# Patient Record
Sex: Male | Born: 1985 | Race: Black or African American | Hispanic: No | Marital: Single | Smoking: Current every day smoker
Health system: Southern US, Community
[De-identification: ages and names within clinical notes are randomized; demographics above are authoritative.]

## PROBLEM LIST (undated history)

## (undated) DIAGNOSIS — R21 Rash and other nonspecific skin eruption: Secondary | ICD-10-CM

## (undated) DIAGNOSIS — L309 Dermatitis, unspecified: Secondary | ICD-10-CM

## (undated) HISTORY — PX: HERNIA REPAIR: SHX51

---

## 2007-08-16 ENCOUNTER — Emergency Department (HOSPITAL_COMMUNITY): Admission: EM | Admit: 2007-08-16 | Discharge: 2007-08-16 | Payer: Self-pay | Admitting: Emergency Medicine

## 2009-09-06 ENCOUNTER — Emergency Department (HOSPITAL_COMMUNITY): Admission: EM | Admit: 2009-09-06 | Discharge: 2009-09-06 | Payer: Self-pay | Admitting: Emergency Medicine

## 2013-03-18 ENCOUNTER — Emergency Department (HOSPITAL_COMMUNITY): Payer: Self-pay

## 2013-03-18 ENCOUNTER — Encounter (HOSPITAL_COMMUNITY): Payer: Self-pay | Admitting: Emergency Medicine

## 2013-03-18 ENCOUNTER — Emergency Department (HOSPITAL_COMMUNITY)
Admission: EM | Admit: 2013-03-18 | Discharge: 2013-03-18 | Disposition: A | Discharge status: Home / Self Care | Payer: Self-pay | Attending: Emergency Medicine | Admitting: Emergency Medicine

## 2013-03-18 DIAGNOSIS — M25511 Pain in right shoulder: Secondary | ICD-10-CM

## 2013-03-18 DIAGNOSIS — S4980XA Other specified injuries of shoulder and upper arm, unspecified arm, initial encounter: Secondary | ICD-10-CM | POA: Insufficient documentation

## 2013-03-18 DIAGNOSIS — Z880 Allergy status to penicillin: Secondary | ICD-10-CM | POA: Insufficient documentation

## 2013-03-18 DIAGNOSIS — X500XXA Overexertion from strenuous movement or load, initial encounter: Secondary | ICD-10-CM | POA: Insufficient documentation

## 2013-03-18 DIAGNOSIS — S43021A Posterior subluxation of right humerus, initial encounter: Secondary | ICD-10-CM

## 2013-03-18 DIAGNOSIS — S43016A Anterior dislocation of unspecified humerus, initial encounter: Secondary | ICD-10-CM | POA: Insufficient documentation

## 2013-03-18 DIAGNOSIS — S43026A Posterior dislocation of unspecified humerus, initial encounter: Secondary | ICD-10-CM | POA: Insufficient documentation

## 2013-03-18 DIAGNOSIS — Y9239 Other specified sports and athletic area as the place of occurrence of the external cause: Secondary | ICD-10-CM | POA: Insufficient documentation

## 2013-03-18 DIAGNOSIS — Y9367 Activity, basketball: Secondary | ICD-10-CM | POA: Insufficient documentation

## 2013-03-18 DIAGNOSIS — S46909A Unspecified injury of unspecified muscle, fascia and tendon at shoulder and upper arm level, unspecified arm, initial encounter: Secondary | ICD-10-CM | POA: Insufficient documentation

## 2013-03-18 DIAGNOSIS — M21821 Other specified acquired deformities of right upper arm: Secondary | ICD-10-CM

## 2013-03-18 MED ORDER — HYDROMORPHONE HCL PF 1 MG/ML IJ SOLN
1.0000 mg | Freq: Once | INTRAMUSCULAR | Status: AC
  Administered 2013-03-18: 1 mg via INTRAMUSCULAR
  Filled 2013-03-18 (×2): qty 1

## 2013-03-18 MED ORDER — HYDROCODONE-ACETAMINOPHEN 5-325 MG PO TABS: 1.0000 | ORAL_TABLET | ORAL | Status: DC | PRN

## 2013-03-18 MED ORDER — DIAZEPAM 5 MG PO TABS
10.0000 mg | ORAL_TABLET | Freq: Once | ORAL | Status: AC
  Administered 2013-03-18: 10 mg via ORAL
  Filled 2013-03-18 (×2): qty 1

## 2013-03-18 MED ORDER — BUPIVACAINE HCL 0.5 % IJ SOLN
30.0000 mL | Freq: Once | INTRAMUSCULAR | Status: DC
  Filled 2013-03-18: qty 30

## 2013-03-18 MED ORDER — BUPIVACAINE HCL (PF) 0.5 % IJ SOLN
30.0000 mL | Freq: Once | INTRAMUSCULAR | Status: AC
  Administered 2013-03-18: 30 mL
  Filled 2013-03-18: qty 30

## 2013-03-18 MED ORDER — HYDROCODONE-ACETAMINOPHEN 5-325 MG PO TABS: 2.0000 | ORAL_TABLET | ORAL | Status: DC | PRN

## 2013-03-18 MED ORDER — HYDROMORPHONE HCL PF 1 MG/ML IJ SOLN
1.0000 mg | Freq: Once | INTRAMUSCULAR | Status: AC
  Administered 2013-03-18: 1 mg via INTRAMUSCULAR
  Filled 2013-03-18: qty 1

## 2013-03-18 NOTE — ED Provider Notes (Addendum)
CSN: [id number]     Arrival date & time 03/18/13  1145 History   First MD Initiated Contact with Patient 03/18/13 1156     Chief Complaint  Patient presents with  . Shoulder Injury   (Consider location/radiation/quality/duration/timing/severity/associated sxs/prior Treatment) HPI Comments: 27 yo male with no medical hx, non smoker presents with right shoulder pain after playing bball and felt pop with reaching and sudden pain.  No hx of dislocations. Decr movement with pain.  Nothing improves. No head injury.  Patient is a 27 y.o. male presenting with shoulder injury. The history is provided by the patient.  Shoulder Injury This is a new problem. Pertinent negatives include no shortness of breath.    History reviewed. No pertinent past medical history. History reviewed. No pertinent past surgical history. No family history on file. History  Substance Use Topics  . Smoking status: Never Smoker   . Smokeless tobacco: Never Used  . Alcohol Use: Yes     Comment: Occassionally     Review of Systems  Constitutional: Negative for fever.  HENT: Negative for neck pain.   Respiratory: Negative for shortness of breath.   Musculoskeletal: Positive for arthralgias. Negative for joint swelling.  Skin: Negative for wound.    Allergies  Penicillins  Home Medications   Current Outpatient Rx  Name  Route  Sig  Dispense  Refill  . ibuprofen (ADVIL,MOTRIN) 200 MG tablet   Oral   Take 800 mg by mouth every 8 (eight) hours as needed for pain.          BP 150/58  Pulse 59  Temp(Src) 98.4 F (36.9 C) (Oral)  Resp 20  SpO2 100% Physical Exam  Nursing note and vitals reviewed. Constitutional: He appears well-developed and well-nourished. No distress.  HENT:  Head: Normocephalic and atraumatic.  Eyes: Conjunctivae are normal.  Neck: Normal range of motion. Neck supple.  Cardiovascular: Normal rate.   Pulmonary/Chest: Effort normal.  Musculoskeletal: He exhibits tenderness. He  exhibits no edema.  Right shoulder tender with all rom,  No obvious deformity nv intact distal, held internal rotation.   Neurological: He is alert.    ED Course  Reduction of dislocation Date/Time: 03/18/2013 3:56 PM Performed by: Enid Skeens Authorized by: Enid Skeens Consent: Verbal consent obtained. written consent obtained. Consent given by: patient Patient identity confirmed: verbally with patient and arm band Time out: Immediately prior to procedure a "time out" was called to verify the correct patient, procedure, equipment, support staff and site/side marked as required. Local anesthesia used: yes Local anesthetic: bupivacaine 0.5% without epinephrine Anesthetic total: 20 ml Patient sedated: no Patient tolerance: Patient tolerated the procedure well with no immediate complications. Comments: Dilaudid im   (including critical care time) Labs Review Labs Reviewed - No data to display Imaging Review Dg Shoulder Right  03/18/2013   *RADIOLOGY REPORT*  Clinical Data: Pain post trauma  RIGHT SHOULDER - 2+ VIEW  Comparison: None.  Findings: Frontal, Y scapular, and axillary views were obtained. There is impaction of the anteromedial humeral head along the inferior glenoid with a focal bony defect in the anteromedial glenoid labrum causing a Hill-Sachs - type defect.  There is no other fracture.  There is no frank dislocation, although there is mild subluxation as described.  No other fracture.  There is no erosive change.  IMPRESSION: There is impaction of the anterior humeral head along the inferior glenoid with a Hill-Sachs type bony defect in the anteromedial humeral head, acute.  No  other fracture.  No frank dislocation, although there is subluxation as noted.   Original Report Authenticated By: Bretta Bang, M.D.    MDM   1. Hill-Sachs deformity with bone bruise of right upper extremity   2. Shoulder pain, acute, right   3. Shoulder subluxation, right, initial  encounter    Concern for dislocation vs subluxation. Xray showed hill sack without dislocation, reviewed. Pain meds given. Pain improved. Close fup with ortho.  Sling placed.    Enid Skeens, MD 03/18/13 1342  On discharge discussion pt still having pain, re-examined and shoulder clinically is dislocated posteriorly. Called radiology for re-read, radiology agrees posterior dislocation.   Consent for reduction. Multiple attemps counter traction and traction, shoulder reduced, sling placed. Close fup stressed with ortho. NV intact.   DC  Enid Skeens, MD 03/18/13 1556

## 2013-03-18 NOTE — ED Notes (Addendum)
DID NOT DISCONTINUE MEDICATION AS DIRECTED IN PREVIOUS ORDER PER EDP

## 2013-03-18 NOTE — ED Notes (Signed)
Pt reports playing basket ball this morning at approximately 0900. Pt states that he felt his right shoulder "popped" and he feels like it is dislocated. Pt denies history of same. Pt has strong radial and ulnar pulses and capillary refill is less than three seconds. Pt can move fingers, however reports pain with movement of the elbow or shoulder joint. Pt is A/O x4.

## 2013-03-18 NOTE — Progress Notes (Signed)
[facility] provided pt with a list of primary care resources in [place].  °

## 2013-03-18 NOTE — ED Notes (Signed)
HOLD DISCHARGE-- AFTER SECOND REVIEW OF FILM EDP [facility]  PERFORMED REDUCTION OF DISLOCATED RIGHT SHOULDER TO PT

## 2014-02-09 ENCOUNTER — Encounter (HOSPITAL_COMMUNITY): Payer: Self-pay | Admitting: Emergency Medicine

## 2014-02-09 ENCOUNTER — Emergency Department (HOSPITAL_COMMUNITY)
Admission: EM | Admit: 2014-02-09 | Discharge: 2014-02-09 | Disposition: A | Discharge status: Home / Self Care | Payer: Self-pay | Attending: Emergency Medicine | Admitting: Emergency Medicine

## 2014-02-09 DIAGNOSIS — F17200 Nicotine dependence, unspecified, uncomplicated: Secondary | ICD-10-CM | POA: Insufficient documentation

## 2014-02-09 DIAGNOSIS — Z880 Allergy status to penicillin: Secondary | ICD-10-CM | POA: Insufficient documentation

## 2014-02-09 DIAGNOSIS — B356 Tinea cruris: Secondary | ICD-10-CM | POA: Insufficient documentation

## 2014-02-09 DIAGNOSIS — Z79899 Other long term (current) drug therapy: Secondary | ICD-10-CM | POA: Insufficient documentation

## 2014-02-09 DIAGNOSIS — IMO0002 Reserved for concepts with insufficient information to code with codable children: Secondary | ICD-10-CM | POA: Insufficient documentation

## 2014-02-09 DIAGNOSIS — R21 Rash and other nonspecific skin eruption: Secondary | ICD-10-CM | POA: Insufficient documentation

## 2014-02-09 HISTORY — DX: Rash and other nonspecific skin eruption: R21

## 2014-02-09 HISTORY — DX: Dermatitis, unspecified: L30.9

## 2014-02-09 MED ORDER — TRIAMCINOLONE ACETONIDE 0.1 % EX CREA
1.0000 | TOPICAL_CREAM | Freq: Two times a day (BID) | CUTANEOUS | Status: DC
Start: 2014-02-09 — End: 2015-09-16

## 2014-02-09 NOTE — ED Notes (Signed)
Pt stated that his headache pain is intermittent, x 2 weeks. Reports that it "feels like his brain is shook". Denies nausea or trauma. C/o itching rash on both inner thighs x 2 weeks Raised bumps on l/temple x 2 weeks

## 2014-02-09 NOTE — Discharge Instructions (Signed)
Jock Itch Jock itch is a fungal infection of the skin in the groin area. It is sometimes called "ringworm" even though it is not caused by a worm. A fungus is a type of germ that thrives in dark, damp places.  CAUSES  This infection may spread from:  A fungus infection elsewhere on the body (such as athlete's foot).  Sharing towels or clothing. This infection is more common in:  Hot, humid climates.  People who wear tight-fitting clothing or wet bathing suits for [place] periods of time.  Athletes.  Overweight people.  People with diabetes. SYMPTOMS  Jock itch causes the following symptoms:  Red, pink or brown rash in the groin. Rash may spread to the thighs, anus, and buttocks.  Itching. DIAGNOSIS  Your caregiver may make the diagnosis by looking at the rash. Sometimes a skin scraping will be sent to test for fungus. Testing can be done either by looking under the microscope or by doing a culture (test to try to grow the fungus). A culture can take up to 2 weeks to come back. TREATMENT  Jock itch may be treated with:  Skin cream or ointment to kill fungus.  Medicine by mouth to kill fungus.  Skin cream or ointment to calm the itching.  Compresses or medicated powders to dry the infected skin. HOME CARE INSTRUCTIONS   Be sure to treat the rash completely. Follow your caregiver's instructions. It can take a couple of weeks to treat. If you do not treat the infection [place] enough, the rash can come back.  Wear loose-fitting clothing.  Men should wear cotton boxer shorts.  Women should wear cotton underwear.  Avoid hot baths.  Dry the groin area well after bathing. SEEK MEDICAL CARE IF:   Your rash is worse.  Your rash is spreading.  Your rash returns after treatment is finished.  Your rash is not gone in 4 weeks. Fungal infections are slow to respond to treatment. Some redness may remain for several weeks after the fungus is gone. SEEK IMMEDIATE MEDICAL CARE  IF:  The area becomes red, warm, tender, and swollen.  You have a fever. Document Released: 06/22/2002 Document Revised: 09/24/2011 Document Reviewed: 05/21/2008 ExitCare Patient Information 2015 [employer]. This information is not intended to replace advice given to you by your health care provider. Make sure you discuss any questions you have with your health care provider.  

## 2014-02-09 NOTE — ED Provider Notes (Signed)
CSN: [id number]     Arrival date & time 02/09/14  [id number] History   None    Chief Complaint  Patient presents with  . Headache    "feels like brain is shook". C/o intermittent pain x 2 weeks. Denies trauma  . Rash    rash on l/leg and face x 2 weeks     (Consider location/radiation/quality/duration/timing/severity/associated sxs/prior Treatment) HPI  Patient presents to the emergency department for evaluation of a headache and rash he has had for 2 weeks. The headache is intermittent and lasts approximately 2-3 seconds at a time. The pain does not happen every day and there is no specific pattern to when the pain presents. He admits to smoking marijuana. He says it feels like his brain has been "shook" and the pain happens. But then it resolves on its own. He denies having any other associated symptoms with the pain. No syncope, chest pain, change in vision, ear pain, neck pain or fevers. No recent trauma.  He would also like to be evaluated for a rash of his bilateral inner thighs. It is itchy he has not tried any treatment for it at home. He sexually active and concerns that may be STD. No rash anywhere else at this time.  Past Medical History  Diagnosis Date  . Rash     rash on neck  . Eczema    Past Surgical History  Procedure Laterality Date  . Hernia repair     Family History  Problem Relation Age of Onset  . Liver disease Father   . Cancer Other    History  Substance Use Topics  . Smoking status: Current Every Day Smoker  . Smokeless tobacco: Never Used  . Alcohol Use: Yes     Comment: Occassionally     Review of Systems   Review of Systems  Gen: no weight loss, fevers, chills, night sweats  Eyes: no discharge or drainage, no occular pain or visual changes  Nose: no epistaxis or rhinorrhea  Mouth: no dental pain, no sore throat  Neck: no neck pain  Lungs:No wheezing or hemoptysis No coughing CV:  No palpitations, dependent edema or orthopnea. No chest  pain Abd: no diarrhea. No nausea or vomiting, No abdominal pain  GU: no dysuria or gross hematuria  MSK:  No muscle weakness, No  pain Neuro: no headache, no focal neurologic deficits  Skin: + rash , no wounds Psyche: no complaints     Allergies  Penicillins  Home Medications   Prior to Admission medications   Medication Sig Start Date End Date Taking? Authorizing Provider  HYDROcodone-acetaminophen (NORCO) 5-325 MG per tablet Take 2 tablets by mouth every 4 (four) hours as needed for pain. 03/18/13   Enid SkeensJoshua M Zavitz, MD  HYDROcodone-acetaminophen (NORCO) 5-325 MG per tablet Take 1-2 tablets by mouth every 4 (four) hours as needed for pain. 03/18/13   Enid SkeensJoshua M Zavitz, MD  ibuprofen (ADVIL,MOTRIN) 200 MG tablet Take 800 mg by mouth every 8 (eight) hours as needed for pain.    Historical Provider, MD  triamcinolone cream (KENALOG) 0.1 % Apply 1 application topically 2 (two) times daily. 02/09/14   Chidera Dearcos Irine SealG Emory Leaver, PA-C   BP 123/74  Pulse 56  Temp(Src) 97.7 F (36.5 C)  Resp 18  SpO2 100% Physical Exam  Nursing note and vitals reviewed. Constitutional: He is oriented to person, place, and time. He appears well-developed and well-nourished. No distress.  HENT:  Head: Normocephalic and atraumatic.  Eyes: Pupils are equal,  round, and reactive to light.  Neck: Normal range of motion. Neck supple.  Cardiovascular: Normal rate and regular rhythm.   Pulmonary/Chest: Effort normal.  Abdominal: Soft.  Genitourinary:  Patches of dry skin to bilateral inner thighs, rash spares scrotum and penis.  Neurological: He is alert and oriented to person, place, and time. He has normal strength. No cranial nerve deficit or sensory deficit. He displays a negative Romberg sign. GCS eye subscore is 4. GCS verbal subscore is 5. GCS motor subscore is 6.  Skin: Skin is warm and dry.    ED Course  Procedures (including critical care time) Labs Review Labs Reviewed - No data to display  Imaging  Review No results found.   EKG Interpretation None      MDM   Final diagnoses:  Jock itch    G/C swabs taken. Referral to a PCP given. Antifungal cream for rash in groin Rx.  27 y.o.Isaac Grant's evaluation in the Emergency Department is complete. It has been determined that no acute conditions requiring further emergency intervention are present at this time. The patient/guardian have been advised of the diagnosis and plan. We have discussed signs and symptoms that warrant return to the ED, such as changes or worsening in symptoms.  Vital signs are stable at discharge. Filed Vitals:   02/09/14 1030  BP: 123/74  Pulse: 56  Temp: 97.7 F (36.5 C)  Resp: 18    Patient/guardian has voiced understanding and agreed to follow-up with the PCP or specialist.       Dorthula Matas, PA-C 02/09/14 1118

## 2014-02-09 NOTE — ED Provider Notes (Signed)
Medical screening examination/treatment/procedure(s) were performed by non-physician practitioner and as supervising physician I was immediately available for consultation/collaboration.     Suzi RootsKevin E Angely Dietz, MD 02/09/14 2011

## 2014-02-10 LAB — GC/CHLAMYDIA PROBE AMP
CT Probe RNA: NEGATIVE
GC PROBE AMP APTIMA: NEGATIVE

## 2014-05-02 ENCOUNTER — Encounter (HOSPITAL_COMMUNITY): Payer: Self-pay | Admitting: Emergency Medicine

## 2014-05-02 ENCOUNTER — Emergency Department (HOSPITAL_COMMUNITY)
Admission: EM | Admit: 2014-05-02 | Discharge: 2014-05-02 | Disposition: A | Discharge status: Home / Self Care | Payer: Self-pay | Attending: Emergency Medicine | Admitting: Emergency Medicine

## 2014-05-02 DIAGNOSIS — Z880 Allergy status to penicillin: Secondary | ICD-10-CM | POA: Insufficient documentation

## 2014-05-02 DIAGNOSIS — Z7952 Long term (current) use of systemic steroids: Secondary | ICD-10-CM | POA: Insufficient documentation

## 2014-05-02 DIAGNOSIS — Z79899 Other long term (current) drug therapy: Secondary | ICD-10-CM | POA: Insufficient documentation

## 2014-05-02 DIAGNOSIS — Z720 Tobacco use: Secondary | ICD-10-CM | POA: Insufficient documentation

## 2014-05-02 DIAGNOSIS — Z872 Personal history of diseases of the skin and subcutaneous tissue: Secondary | ICD-10-CM | POA: Insufficient documentation

## 2014-05-02 DIAGNOSIS — R21 Rash and other nonspecific skin eruption: Secondary | ICD-10-CM | POA: Insufficient documentation

## 2014-05-02 LAB — HIV ANTIBODY (ROUTINE TESTING W REFLEX): HIV: NONREACTIVE

## 2014-05-02 LAB — RPR

## 2014-05-02 NOTE — ED Provider Notes (Signed)
CSN: [id number]     Arrival date & time 05/02/14  1602 History   None    Chief Complaint  Patient presents with  . Rash   Patient is a 28 y.o. male presenting with rash. The history is provided by the patient. No language interpreter was used.  Rash  This chart was scribed for non-physician practitioner, Mayme GentaBen Jhanvi Drakeford PA-C working with No att. providers found, by Andrew Auaven Small, ED Scribe. This patient was seen in room WTR7/WTR7 and the patient's care was started at 5:20 PM.  Isaac Grant is a 28 y.o. male with hx eczema who presents to the Emergency Department complaining of an itchy, spreading rash located to right arms and bilateral upper legs that began a few days ago. Pt was seen her 3 months ago by Dr. Denton LankSteinl for jock itch and was prescribed kenalog cream.  Pt has tried cream for a week with relief to rash but reports rash has returned.  Patient states that "I have a lot of sex and I really want to get checked out". Denies fevers, dysuria, penile discharge, scrotal pain  Past Medical History  Diagnosis Date  . Rash     rash on neck  . Eczema    Past Surgical History  Procedure Laterality Date  . Hernia repair     Family History  Problem Relation Age of Onset  . Liver disease Father   . Cancer Other    History  Substance Use Topics  . Smoking status: Current Every Day Smoker  . Smokeless tobacco: Never Used  . Alcohol Use: Yes     Comment: Occassionally     Review of Systems  Skin: Positive for rash.  All other systems reviewed and are negative.  Allergies  Penicillins  Home Medications   Prior to Admission medications   Medication Sig Start Date End Date Taking? Authorizing Provider  HYDROcodone-acetaminophen (NORCO) 5-325 MG per tablet Take 2 tablets by mouth every 4 (four) hours as needed for pain. 03/18/13   Enid SkeensJoshua M Zavitz, MD  HYDROcodone-acetaminophen (NORCO) 5-325 MG per tablet Take 1-2 tablets by mouth every 4 (four) hours as needed for pain. 03/18/13   Enid SkeensJoshua  M Zavitz, MD  ibuprofen (ADVIL,MOTRIN) 200 MG tablet Take 800 mg by mouth every 8 (eight) hours as needed for pain.    Historical Provider, MD  triamcinolone cream (KENALOG) 0.1 % Apply 1 application topically 2 (two) times daily. 02/09/14   Tiffany Irine SealG Greene, PA-C   BP 133/70  Pulse 52  Temp(Src) 97.6 F (36.4 C) (Oral)  Resp 14  SpO2 100% Physical Exam  Nursing note and vitals reviewed. Constitutional: He is oriented to person, place, and time. He appears well-developed and well-nourished. No distress.  HENT:  Head: Normocephalic and atraumatic.  Eyes: Conjunctivae and EOM are normal.  Neck: Neck supple.  Cardiovascular: Normal rate.   Pulmonary/Chest: Effort normal.  Genitourinary: No penile tenderness.  Scrotum is diffusely dry with apparent eczema headache rash. No tenderness, edema or overt erythema  Musculoskeletal: Normal range of motion.  Neurological: He is alert and oriented to person, place, and time.  Skin: Skin is warm and dry.  Psychiatric: He has a normal mood and affect. His behavior is normal.    ED Course  Procedures (including critical care time) DIAGNOSTIC STUDIES: Oxygen Saturation is 100% on RA, normal by my interpretation.    COORDINATION OF CARE: 5:26 PM- Pt advised of plan for treatment and pt agrees.  Labs Review Labs Reviewed - No  data to display  Imaging Review No results found.   EKG Interpretation None      MDM  Vitals stable - WNL -afebrile Pt resting comfortably in ED. PE not concerning for acute or emergent pathology. No evidence of cellulitis, SJS, TEN,  Rash most consistent with eczema. Recommended moisturizing lotion instead of antifungal.  Discussed f/u with PCP and return precautions, pt very amenable to plan. Patient stable, in good condition and is appropriate for discharge   Final diagnoses:  Rash   I personally performed the services described in this documentation, which was scribed in my presence. The recorded  information has been reviewed and is accurate.       Sharlene MottsBenjamin W Lukas Pelcher, PA-C 05/02/14 [phone]

## 2014-05-02 NOTE — ED Notes (Signed)
Pt escorted to discharge window. Pt verbalized understanding discharge instructions. In no acute distress.  

## 2014-05-02 NOTE — ED Provider Notes (Signed)
Medical screening examination/treatment/procedure(s) were performed by non-physician practitioner and as supervising physician I was immediately available for consultation/collaboration.   EKG Interpretation None        Gilda Creasehristopher J. Pollina, MD 05/02/14 [phone]

## 2014-05-02 NOTE — ED Notes (Signed)
Pt reports itchy rash to arms and groin area x few days. Pt reports using "ringworm" medication and it went away but now its back.

## 2014-05-03 LAB — GC/CHLAMYDIA PROBE AMP
CT PROBE, AMP APTIMA: NEGATIVE
GC PROBE AMP APTIMA: NEGATIVE

## 2014-09-06 IMAGING — CR DG SHOULDER 2+V*R*
3 series · 3 of 3 positions shown · non-contrast
Comparison: None.

***ADDENDUM*** CREATED: 03/18/2013 [DATE]

Comment: On further review, there may be a degree of frank
posterior dislocation.
***END ADDENDUM*** SIGNED BY: Kediboni Loxton, M.D.
CLINICAL DATA: Pain post trauma
RIGHT SHOULDER - 2+ VIEW

[w shoulder internal right]
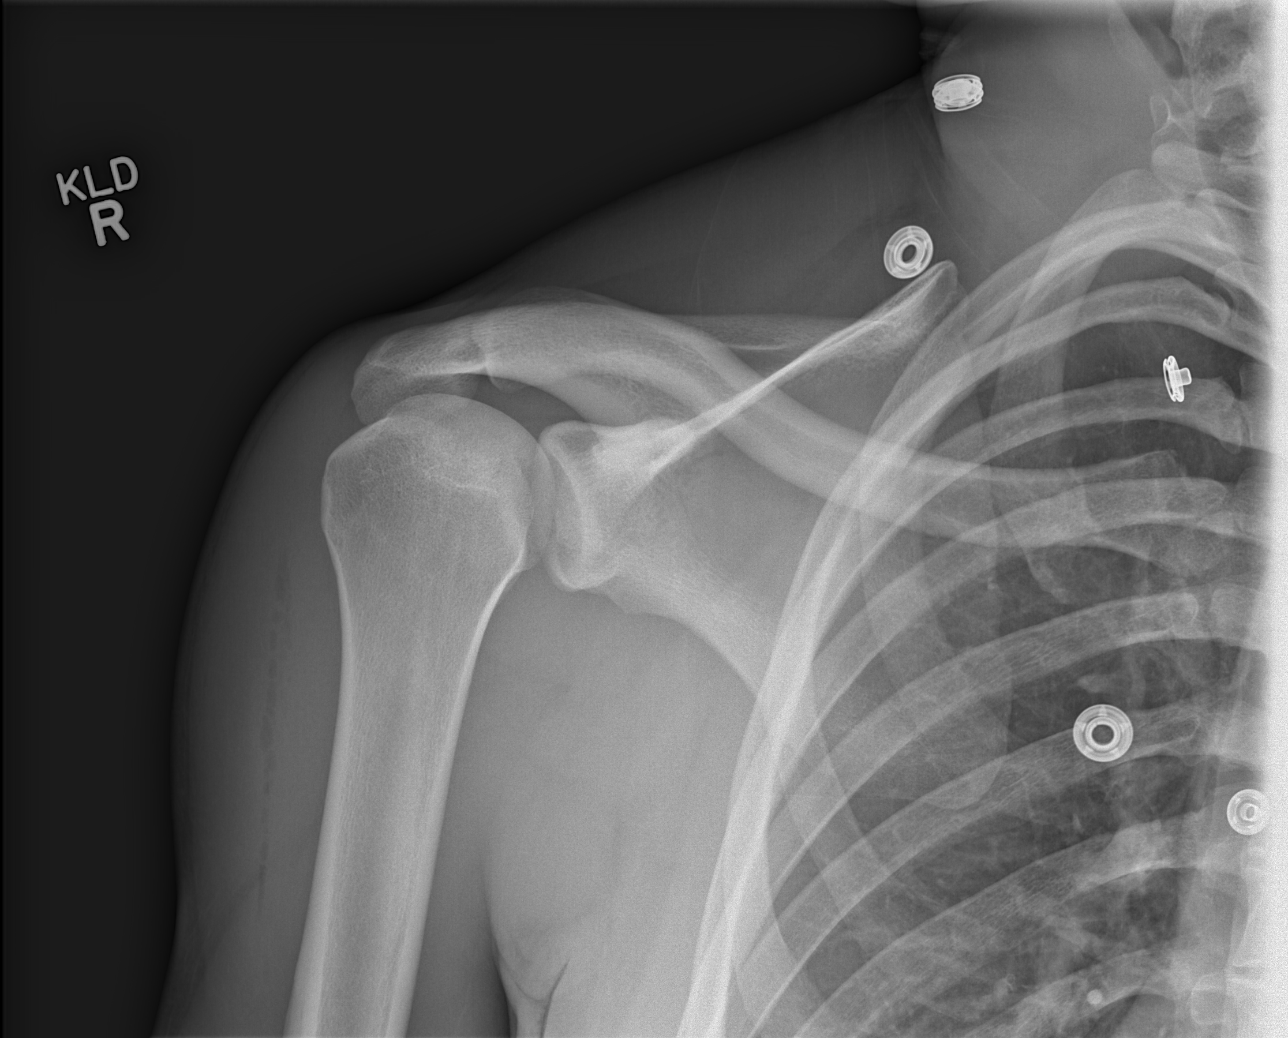

[w shoulder y-view right]
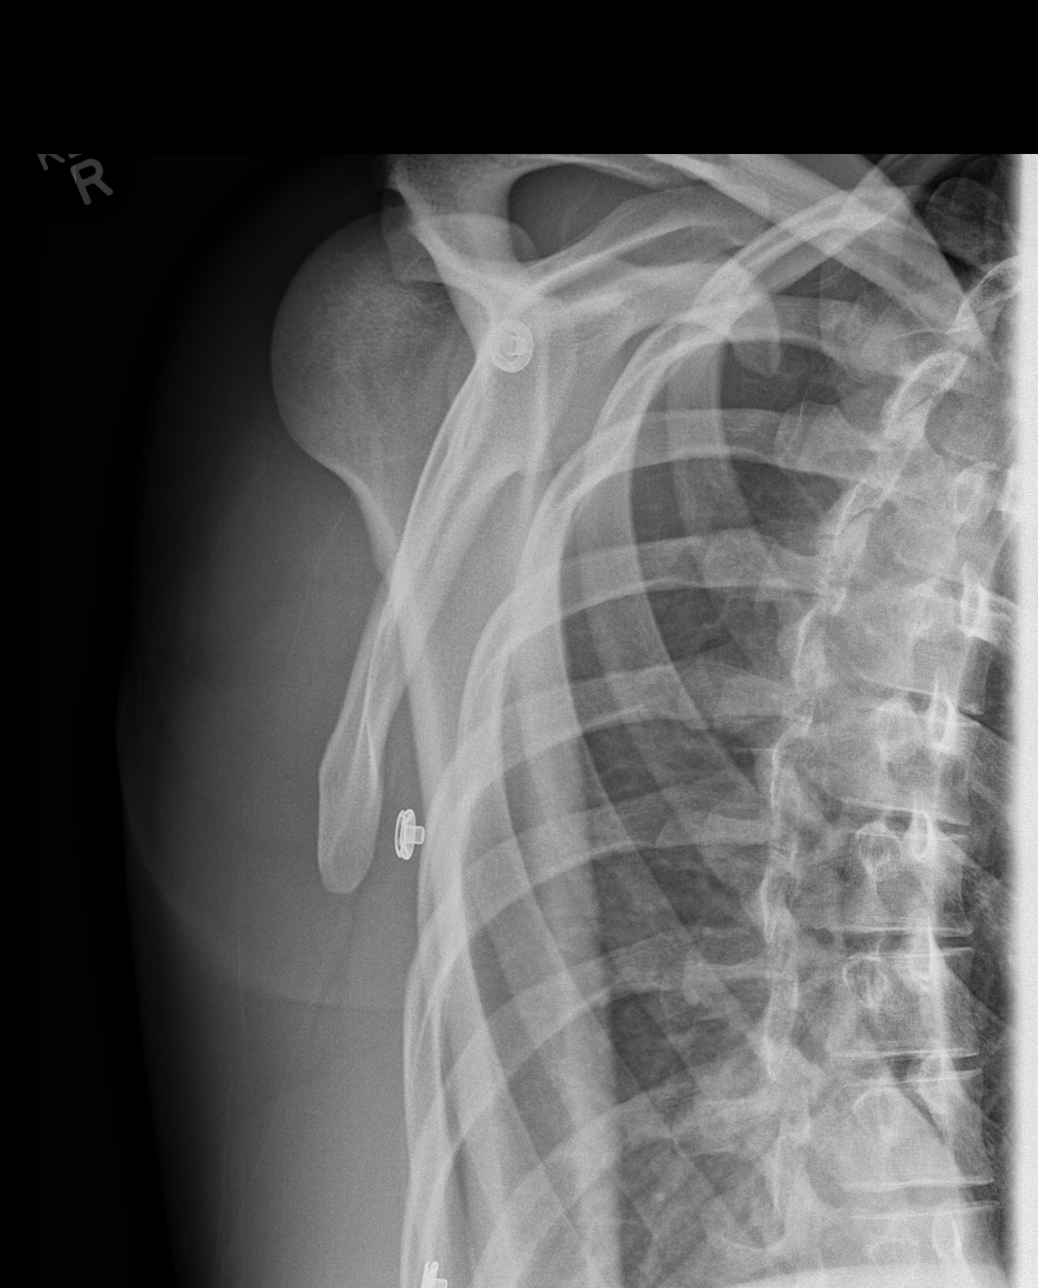

[x shoulder axillary right]
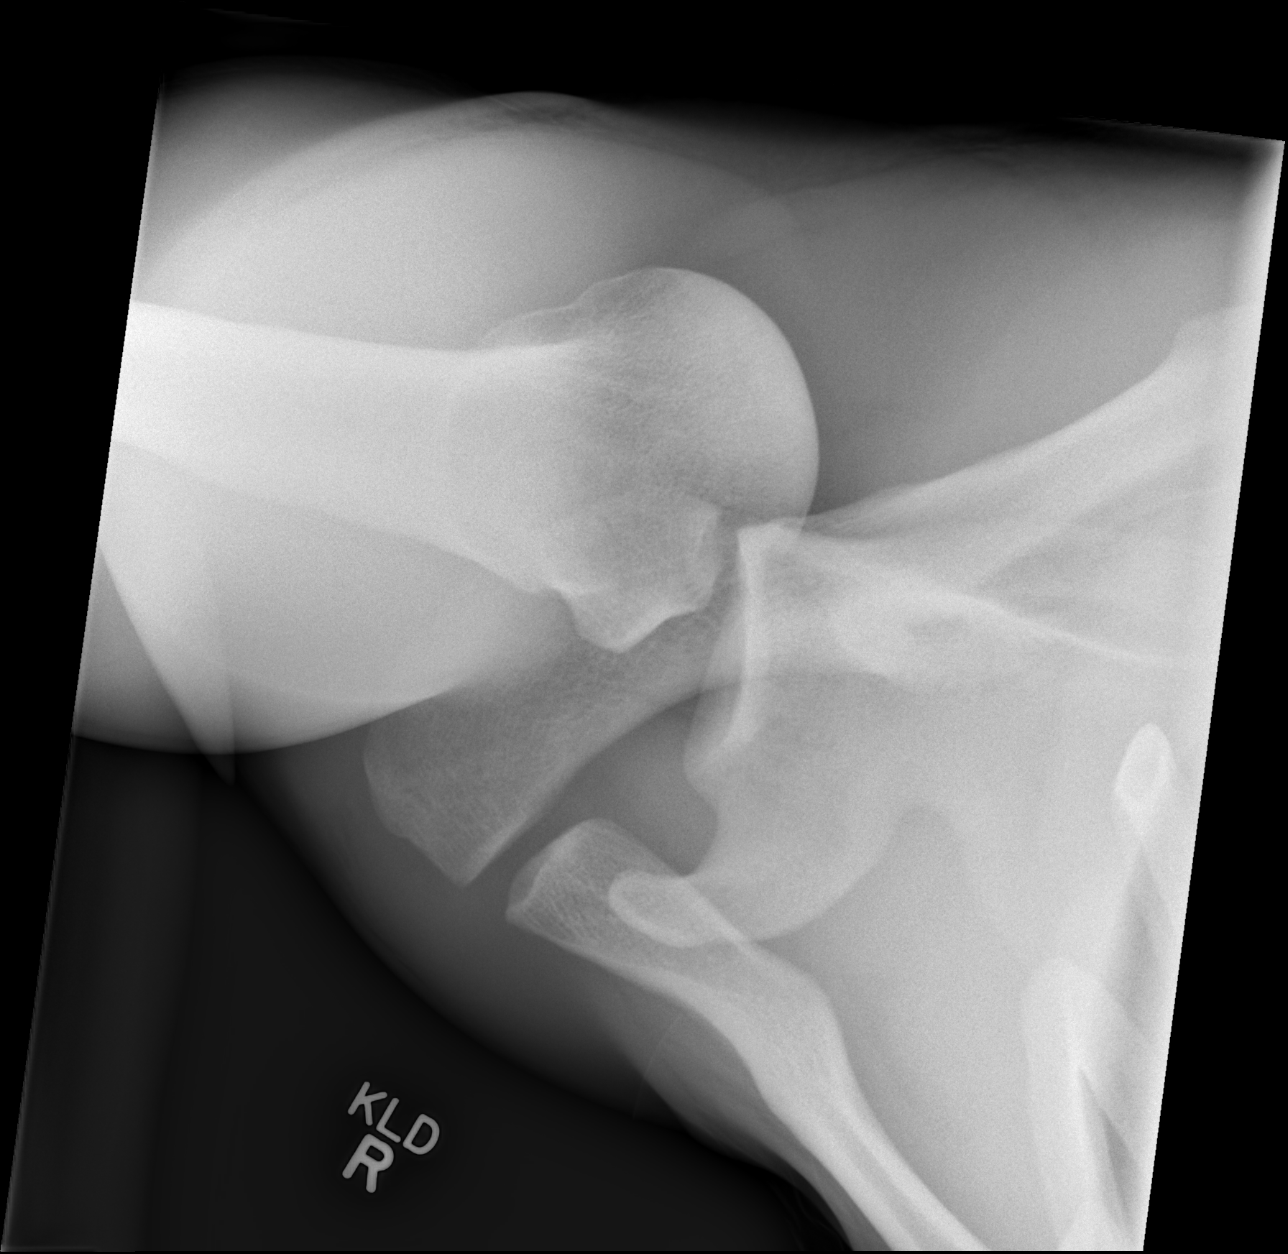

[3 of 3 positions shown; findings below may reference images not displayed]

FINDINGS: Frontal, Y scapular, and axillary views were obtained.
There is impaction of the anteromedial humeral head along the
inferior glenoid with a focal bony defect in the anteromedial
glenoid labrum causing a Hill-Sachs - type defect.  There is no
other fracture.  There is no frank dislocation, although there is
mild subluxation as described.  No other fracture.  There is no
erosive change.
IMPRESSION: There is impaction of the anterior humeral head along the inferior
glenoid with a Hill-Sachs type bony defect in the anteromedial
humeral head, acute.  No other fracture.  No frank dislocation,
although there is subluxation as noted.

## 2014-09-06 IMAGING — CR DG SHOULDER 2+V*R*
2 series · 2 of 2 positions shown · non-contrast
Comparison: 03/18/2013

CLINICAL DATA: Shoulder injury

RIGHT SHOULDER - 2+ VIEW

[w shoulder external right]
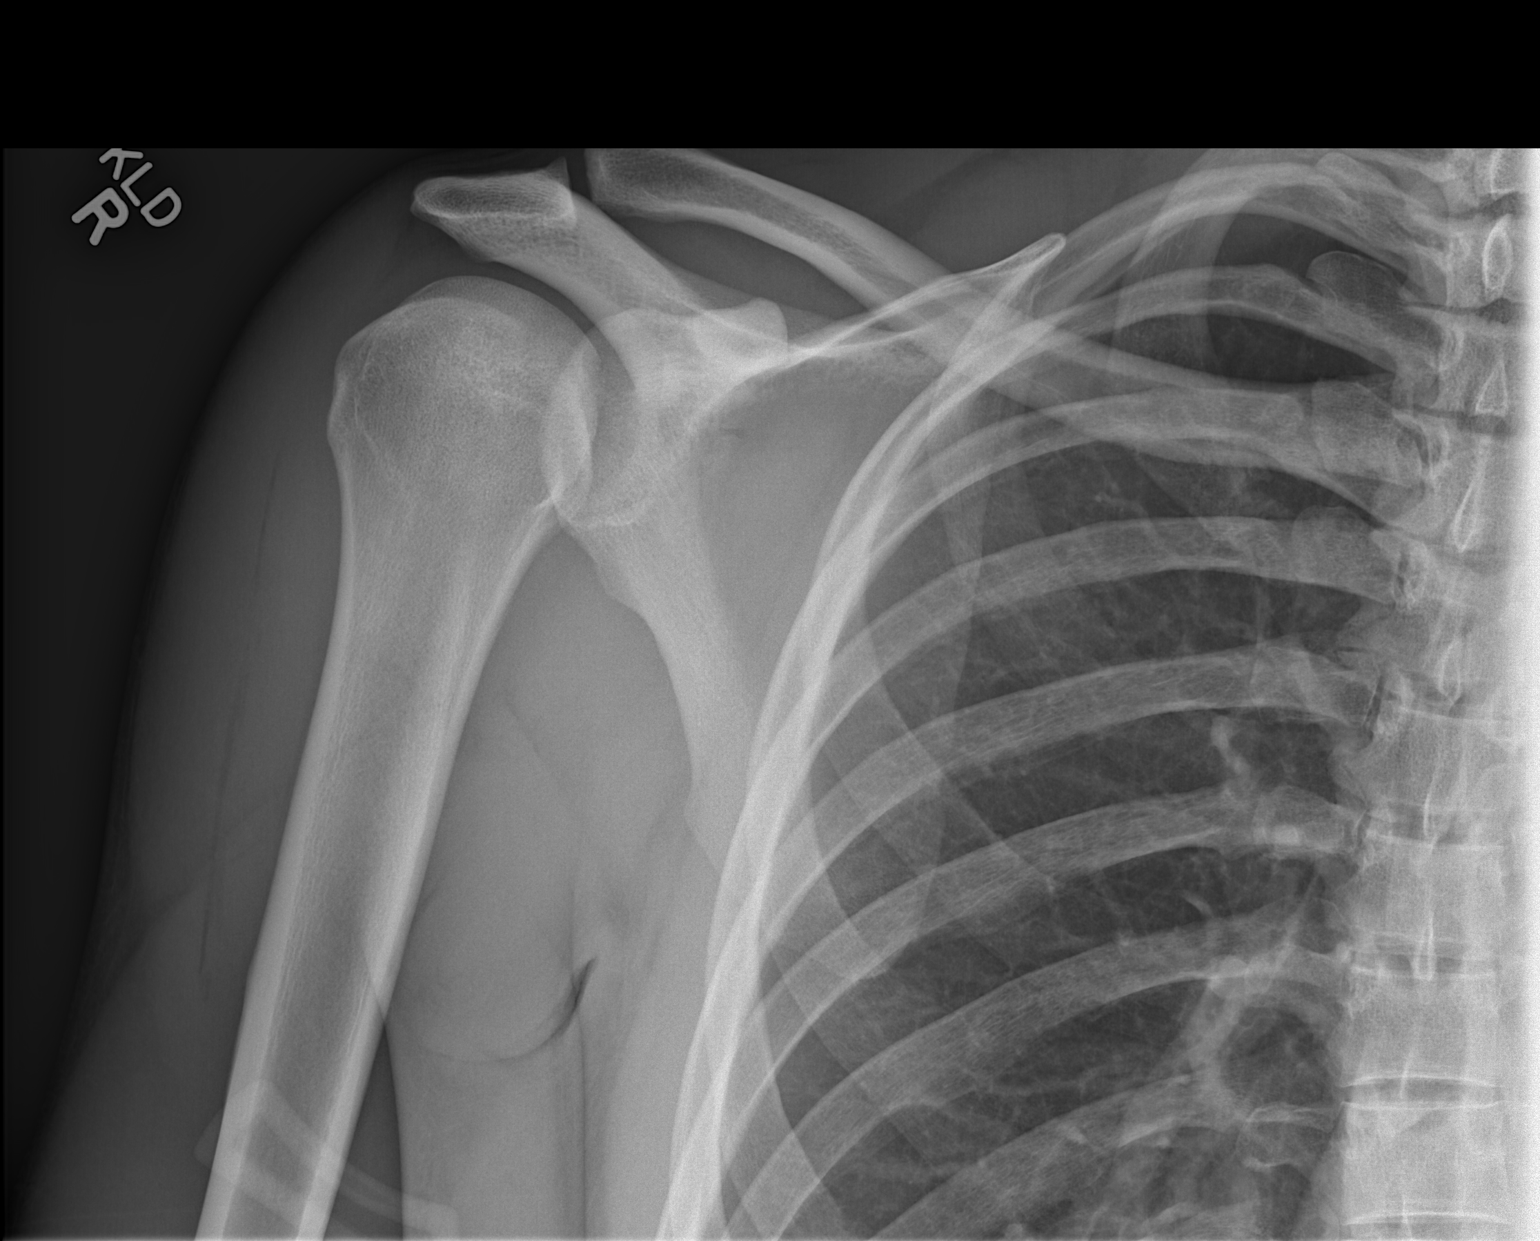

[w shoulder y-view right]
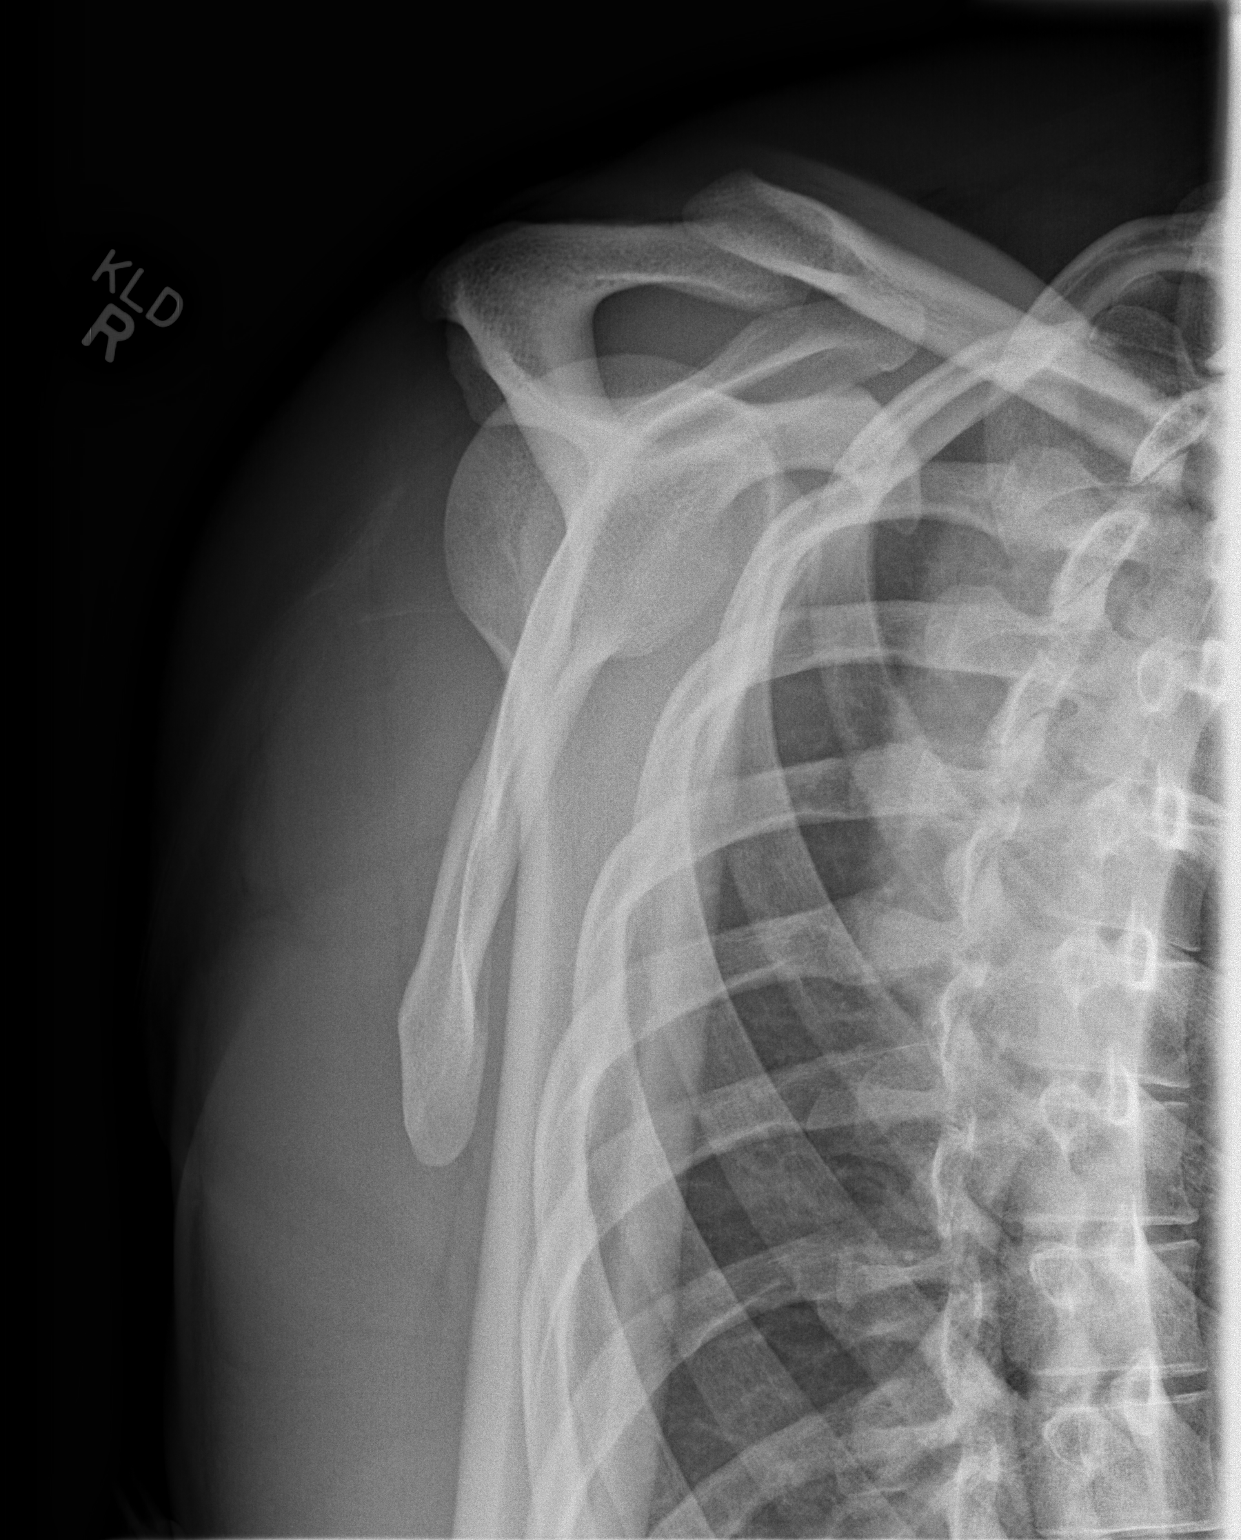

[2 of 2 positions shown; findings below may reference images not displayed]

FINDINGS: Improved alignment following reduction of the posterior
dislocation.  AC joint aligned.  No definite acute osseous finding.
IMPRESSION: Improved alignment post reduction.

## 2015-09-16 ENCOUNTER — Encounter (HOSPITAL_COMMUNITY): Payer: Self-pay | Admitting: Emergency Medicine

## 2015-09-16 ENCOUNTER — Emergency Department (HOSPITAL_COMMUNITY): Payer: Self-pay

## 2015-09-16 ENCOUNTER — Emergency Department (HOSPITAL_COMMUNITY)
Admission: EM | Admit: 2015-09-16 | Discharge: 2015-09-17 | Disposition: A | Discharge status: Home / Self Care | Payer: Self-pay | Attending: Emergency Medicine | Admitting: Emergency Medicine

## 2015-09-16 DIAGNOSIS — J3489 Other specified disorders of nose and nasal sinuses: Secondary | ICD-10-CM | POA: Insufficient documentation

## 2015-09-16 DIAGNOSIS — E876 Hypokalemia: Secondary | ICD-10-CM | POA: Insufficient documentation

## 2015-09-16 DIAGNOSIS — F17200 Nicotine dependence, unspecified, uncomplicated: Secondary | ICD-10-CM | POA: Insufficient documentation

## 2015-09-16 DIAGNOSIS — F1410 Cocaine abuse, uncomplicated: Secondary | ICD-10-CM | POA: Insufficient documentation

## 2015-09-16 DIAGNOSIS — R0600 Dyspnea, unspecified: Secondary | ICD-10-CM | POA: Insufficient documentation

## 2015-09-16 DIAGNOSIS — F329 Major depressive disorder, single episode, unspecified: Secondary | ICD-10-CM | POA: Insufficient documentation

## 2015-09-16 DIAGNOSIS — Z872 Personal history of diseases of the skin and subcutaneous tissue: Secondary | ICD-10-CM | POA: Insufficient documentation

## 2015-09-16 DIAGNOSIS — F1010 Alcohol abuse, uncomplicated: Secondary | ICD-10-CM | POA: Insufficient documentation

## 2015-09-16 DIAGNOSIS — Z880 Allergy status to penicillin: Secondary | ICD-10-CM | POA: Insufficient documentation

## 2015-09-16 DIAGNOSIS — F32A Depression, unspecified: Secondary | ICD-10-CM

## 2015-09-16 DIAGNOSIS — R0602 Shortness of breath: Secondary | ICD-10-CM | POA: Insufficient documentation

## 2015-09-16 LAB — CBC WITH DIFFERENTIAL/PLATELET
BASOS ABS: 0 10*3/uL (ref 0.0–0.1)
Basophils Relative: 0 %
Eosinophils Absolute: 0.1 10*3/uL (ref 0.0–0.7)
Eosinophils Relative: 1 %
HEMATOCRIT: 42.2 % (ref 39.0–52.0)
Hemoglobin: 13.9 g/dL (ref 13.0–17.0)
LYMPHS PCT: 30 %
Lymphs Abs: 1.7 10*3/uL (ref 0.7–4.0)
MCH: 29.1 pg (ref 26.0–34.0)
MCHC: 32.9 g/dL (ref 30.0–36.0)
MCV: 88.5 fL (ref 78.0–100.0)
Monocytes Absolute: 0.6 10*3/uL (ref 0.1–1.0)
Monocytes Relative: 10 %
NEUTROS ABS: 3.3 10*3/uL (ref 1.7–7.7)
NEUTROS PCT: 59 %
Platelets: 231 10*3/uL (ref 150–400)
RBC: 4.77 MIL/uL (ref 4.22–5.81)
RDW: 12.2 % (ref 11.5–15.5)
WBC: 5.6 10*3/uL (ref 4.0–10.5)

## 2015-09-16 NOTE — ED Notes (Addendum)
Pt transported from home with c/o fear he is going to die. Pt admits to cocaine usage today. Per EMS pt hyperventilating on arrival, cramping to hands and feet. Pt very anxious, IV est by EMS. Pt arrived with mother, pt does not want mother to be aware of drug usage Pt states he has been using cocaine, marijuana and etoh daily since his father died Dec 6th. Pt used drugs all night last night, @ 3 hours ago attempted to sleep and began having SHOB, denies pain.

## 2015-09-16 NOTE — ED Provider Notes (Signed)
CSN: [id number]     Arrival date & time 09/16/15  2146 History   By signing my name below, I, Arlan Organ, attest that this documentation has been prepared under the direction and in the presence of Devoria Albe, MD at 2337.  Electronically Signed: Arlan Organ, ED Scribe. 09/16/2015. 11:45 PM.   Chief Complaint  Patient presents with  . Fatigue   The history is provided by the patient. No language interpreter was used.    HPI Comments: Isaac Grant brought in by EMS transported from home is a 30 y.o. male without any pertinent past medical history who presents to the Emergency Department complaining of intermittent, ongoing shortness of breath onset 6:30 PM this evening. Pt also reports mild rhinorrhea. He admits to cocaine use last night. First use of cocaine 10 years ago with more frequent use a few months ago. Pt admits he is also drinking half a pint of alcohol and a few beers daily for the past several months. Pt denies any cigarette use but admits to marijuana use. No recent fever, chills, cough,Chest pain, rhinorrhea or wheezing. Father recently passed away on 07/21/2015. No SI or HI at this time.When asked if he was suicidal he said questionable may be. He states he does not need inpatient psychiatric admission for his depression. He states he has been up all night in his nose is stuffy.    PCP: None  Past Medical History  Diagnosis Date  . Rash     rash on neck  . Eczema    Past Surgical History  Procedure Laterality Date  . Hernia repair     Family History  Problem Relation Age of Onset  . Liver disease Father   . Cancer Other    Social History  Substance Use Topics  . Smoking status: Current Every Day Smoker  . Smokeless tobacco: Never Used  . Alcohol Use: Yes     Comment: Occassionally   Pt is not currently working but is not on disability. Lives with mother  Review of Systems  Constitutional: Negative for fever and chills.  HENT: Positive for rhinorrhea.    Respiratory: Positive for shortness of breath.   Gastrointestinal: Negative for nausea, vomiting, abdominal pain and diarrhea.  Neurological: Negative for headaches.  Psychiatric/Behavioral: Negative for confusion.  All other systems reviewed and are negative.     Allergies  Penicillins  Home Medications   Prior to Admission medications   Medication Sig Start Date End Date Taking? Authorizing Provider  potassium chloride SA (K-DUR,KLOR-CON) 20 MEQ tablet Take 1 tablet (20 mEq total) by mouth 2 (two) times daily. 09/17/15   Devoria Albe, MD   Triage Vitals: BP 126/93 mmHg  Pulse 62  Temp(Src) 97.7 F (36.5 C) (Oral)  Resp 20  Ht  (1.803 m)  SpO2 100%  Vital signs normal     Physical Exam  Constitutional: He is oriented to person, place, and time. He appears well-developed and well-nourished.  Non-toxic appearance. He does not appear ill. No distress.  Patient speaks slowly and keeps nodding his head   HENT:  Head: Normocephalic and atraumatic.  Right Ear: External ear normal.  Left Ear: External ear normal.  Nose: Nose normal. No mucosal edema or rhinorrhea.  Mouth/Throat: Oropharynx is clear and moist and mucous membranes are normal. No dental abscesses or uvula swelling.  Eyes: Conjunctivae and EOM are normal. Pupils are equal, round, and reactive to light.  Neck: Normal range of motion and full passive  range of motion without pain. Neck supple.  Cardiovascular: Normal rate, regular rhythm and normal heart sounds.  Exam reveals no gallop and no friction rub.   No murmur heard. Pulmonary/Chest: Effort normal and breath sounds normal. No respiratory distress. He has no wheezes. He has no rhonchi. He has no rales. He exhibits no tenderness and no crepitus.  Abdominal: Soft. Normal appearance and bowel sounds are normal. He exhibits no distension. There is no tenderness. There is no rebound and no guarding.  Musculoskeletal: Normal range of motion. He exhibits no edema or  tenderness.  Moves all extremities well.   Neurological: He is alert and oriented to person, place, and time. He has normal strength. No cranial nerve deficit.  Skin: Skin is warm, dry and intact. No rash noted. No erythema. No pallor.  Psychiatric: His mood appears not anxious. His speech is delayed. He is slowed.  Flat affect   Nursing note and vitals reviewed.   ED Course  Procedures (including critical care time)     DIAGNOSTIC STUDIES: Oxygen Saturation is 100% on RA, Normal by my interpretation.    COORDINATION OF CARE: 11:37 PM- patient's pulse ox is 100% on room air during my interview. Patient is demanding to be placed on oxygen. I explained to him that his oxygen is perfect he doesn't need oxygen.  Nursing staff ambulated the patient with a pulse ox. He walked slowly and his pulse ox remained 100%.  Nurses report patient refused to have the second delta troponin done. He states that he felt like we should be giving him blood instead of drawing blood.   2:49 AM- Updated pt on results. Patient seems more awake and alert than he did earlier. Will provide with resources for alcohol and cocaine abuse along with resources for depression  Mother was asked to leave the room when he was given his test results and his discharge plan per patient request. Labs Review  Results for orders placed or performed during the hospital encounter of 09/16/15  Comprehensive metabolic panel  Result Value Ref Range   Sodium 137 135 - 145 mmol/L   Potassium 3.3 (L) 3.5 - 5.1 mmol/L   Chloride 101 101 - 111 mmol/L   CO2 22 22 - 32 mmol/L   Glucose, Bld 88 65 - 99 mg/dL   BUN 9 6 - 20 mg/dL   Creatinine, Ser 7.821.05 0.61 - 1.24 mg/dL   Calcium 9.6 8.9 - 95.610.3 mg/dL   Total Protein 7.7 6.5 - 8.1 g/dL   Albumin 4.9 3.5 - 5.0 g/dL   AST 33 15 - 41 U/L   ALT 23 17 - 63 U/L   Alkaline Phosphatase 56 38 - 126 U/L   Total Bilirubin 1.2 0.3 - 1.2 mg/dL   GFR calc non Af Amer >60 >60 mL/min   GFR calc  Af Amer >60 >60 mL/min   Anion gap 14 5 - 15  CBC with Differential  Result Value Ref Range   WBC 5.6 4.0 - 10.5 K/uL   RBC 4.77 4.22 - 5.81 MIL/uL   Hemoglobin 13.9 13.0 - 17.0 g/dL   HCT 21.342.2 08.639.0 - 57.852.0 %   MCV 88.5 78.0 - 100.0 fL   MCH 29.1 26.0 - 34.0 pg   MCHC 32.9 30.0 - 36.0 g/dL   RDW 46.912.2 62.911.5 - 52.815.5 %   Platelets 231 150 - 400 K/uL   Neutrophils Relative % 59 %   Neutro Abs 3.3 1.7 - 7.7 K/uL   Lymphocytes  Relative 30 %   Lymphs Abs 1.7 0.7 - 4.0 K/uL   Monocytes Relative 10 %   Monocytes Absolute 0.6 0.1 - 1.0 K/uL   Eosinophils Relative 1 %   Eosinophils Absolute 0.1 0.0 - 0.7 K/uL   Basophils Relative 0 %   Basophils Absolute 0.0 0.0 - 0.1 K/uL  Ethanol  Result Value Ref Range   Alcohol, Ethyl (B) <5 <5 mg/dL  Troponin I  Result Value Ref Range   Troponin I <0.03 <0.031 ng/mL   Laboratory interpretation all normal except for mild hypokalemia    Imaging Review Dg Chest 2 View  09/16/2015  CLINICAL DATA:  Shortness of breath.  Alcohol and drug use. EXAM: CHEST  2 VIEW COMPARISON:  None. FINDINGS: Midline trachea. Normal heart size and mediastinal contours. No pleural effusion or pneumothorax. Clear lungs. IMPRESSION: No acute cardiopulmonary disease. Electronically Signed   By: Jeronimo Greaves M.D.   On: 09/16/2015 22:49   I have personally reviewed and evaluated these images and lab results as part of my medical decision-making.   EKG Interpretation   Date/Time:  Friday September 16 2015 22:09:08 EST Ventricular Rate:  72 PR Interval:  168 QRS Duration: 93 QT Interval:  383 QTC Calculation: 419 R Axis:   73 Text Interpretation:  Sinus rhythm RSR' in V1 or V2, probably normal  variant ST elev, probable normal early repol pattern No significant change  since last tracing 06 Sep 2009 Confirmed by [facility]  MD-I, Afsa Meany ([id number]) on  09/16/2015 11:04:52 PM      MDM   Final diagnoses:  Dyspnea  Cocaine abuse  Alcohol abuse  Depression  Hypokalemia   Discharge  Medication List as of 09/17/2015  2:33 AM    START taking these medications   Details  potassium chloride SA (K-DUR,KLOR-CON) 20 MEQ tablet Take 1 tablet (20 mEq total) by mouth 2 (two) times daily., Starting 09/17/2015, Until Discontinued, Print        Plan discharge  Devoria Albe, MD, FACEP    I personally performed the services described in this documentation, which was scribed in my presence. The recorded information has been reviewed and considered.  Devoria Albe, MD, Concha Pyo, MD 09/17/15 [phone]

## 2015-09-17 LAB — COMPREHENSIVE METABOLIC PANEL
ALT: 23 U/L (ref 17–63)
ANION GAP: 14 (ref 5–15)
AST: 33 U/L (ref 15–41)
Albumin: 4.9 g/dL (ref 3.5–5.0)
Alkaline Phosphatase: 56 U/L (ref 38–126)
BILIRUBIN TOTAL: 1.2 mg/dL (ref 0.3–1.2)
BUN: 9 mg/dL (ref 6–20)
CO2: 22 mmol/L (ref 22–32)
Calcium: 9.6 mg/dL (ref 8.9–10.3)
Chloride: 101 mmol/L (ref 101–111)
Creatinine, Ser: 1.05 mg/dL (ref 0.61–1.24)
GFR calc Af Amer: >60 mL/min (ref >60)
GFR calc non Af Amer: >60 mL/min (ref >60)
GLUCOSE: 88 mg/dL (ref 65–99)
POTASSIUM: 3.3 mmol/L — AB (ref 3.5–5.1)
Sodium: 137 mmol/L (ref 135–145)
Total Protein: 7.7 g/dL (ref 6.5–8.1)

## 2015-09-17 LAB — ETHANOL: Alcohol, Ethyl (B): <5 mg/dL (ref <5)

## 2015-09-17 LAB — TROPONIN I: Troponin I: <0.03 ng/mL (ref <0.031)

## 2015-09-17 MED ORDER — POTASSIUM CHLORIDE CRYS ER 20 MEQ PO TBCR: 20.0000 meq | EXTENDED_RELEASE_TABLET | Freq: Two times a day (BID) | ORAL | Status: DC

## 2015-09-17 NOTE — ED Notes (Signed)
phelbotomist stated pt did not want labs, stating he was took weak and didn't want anymore blood drawn would like to just be d/c home. Notified dr. Lynelle DoctorKnapp of the same

## 2015-09-17 NOTE — ED Notes (Signed)
Pt refused blood work, says "he's ready to go and that we, should give him blood work instead of staff drawing blood from himPharmacist, community."  Writer notified RN

## 2015-09-17 NOTE — ED Notes (Signed)
Pt given two warm blankets for comfort measures,  One placed around his shoulders per his request and other placed on his stomach

## 2015-09-17 NOTE — Discharge Instructions (Signed)
Drink more fluids. I gave you a list of resources you can go to to discuss stopping drinking and using cocaine. You can also get help with your depression. Return to the ED if you feel you will do something to hurt yourself or someone else.  Your lab work is good, you are not anemic, your kidney function is normal, but your potassium is mildly low, take the potassium pills until gone. Your chest x-ray was normal and your oxygen level was 100% which is as good as it can be. Even when you walked your oxygen remained 100%.

## 2016-03-02 ENCOUNTER — Encounter (HOSPITAL_COMMUNITY): Payer: Self-pay

## 2016-03-02 ENCOUNTER — Emergency Department (HOSPITAL_COMMUNITY)
Admission: EM | Admit: 2016-03-02 | Discharge: 2016-03-02 | Disposition: A | Discharge status: Home / Self Care | Payer: Self-pay | Attending: Emergency Medicine | Admitting: Emergency Medicine

## 2016-03-02 DIAGNOSIS — F17200 Nicotine dependence, unspecified, uncomplicated: Secondary | ICD-10-CM | POA: Insufficient documentation

## 2016-03-02 DIAGNOSIS — R630 Anorexia: Secondary | ICD-10-CM | POA: Insufficient documentation

## 2016-03-02 DIAGNOSIS — R112 Nausea with vomiting, unspecified: Secondary | ICD-10-CM | POA: Insufficient documentation

## 2016-03-02 DIAGNOSIS — R197 Diarrhea, unspecified: Secondary | ICD-10-CM | POA: Insufficient documentation

## 2016-03-02 DIAGNOSIS — F1290 Cannabis use, unspecified, uncomplicated: Secondary | ICD-10-CM | POA: Insufficient documentation

## 2016-03-02 LAB — COMPREHENSIVE METABOLIC PANEL
ALBUMIN: 4.2 g/dL (ref 3.5–5.0)
ALT: 29 U/L (ref 17–63)
ANION GAP: 6 (ref 5–15)
AST: 30 U/L (ref 15–41)
Alkaline Phosphatase: 48 U/L (ref 38–126)
BILIRUBIN TOTAL: 0.5 mg/dL (ref 0.3–1.2)
BUN: 13 mg/dL (ref 6–20)
CALCIUM: 8.8 mg/dL — AB (ref 8.9–10.3)
CO2: 24 mmol/L (ref 22–32)
Chloride: 108 mmol/L (ref 101–111)
Creatinine, Ser: 1.1 mg/dL (ref 0.61–1.24)
Glucose, Bld: 85 mg/dL (ref 65–99)
POTASSIUM: 3.9 mmol/L (ref 3.5–5.1)
Sodium: 138 mmol/L (ref 135–145)
TOTAL PROTEIN: 7 g/dL (ref 6.5–8.1)

## 2016-03-02 LAB — URINALYSIS, ROUTINE W REFLEX MICROSCOPIC
Bilirubin Urine: NEGATIVE
Glucose, UA: NEGATIVE mg/dL
HGB URINE DIPSTICK: NEGATIVE
Ketones, ur: NEGATIVE mg/dL
LEUKOCYTES UA: NEGATIVE
NITRITE: NEGATIVE
Protein, ur: NEGATIVE mg/dL
SPECIFIC GRAVITY, URINE: 1.033 — AB (ref 1.005–1.030)
pH: 6 (ref 5.0–8.0)

## 2016-03-02 LAB — LIPASE, BLOOD: Lipase: 25 U/L (ref 11–51)

## 2016-03-02 LAB — CBC
HEMATOCRIT: 40.5 % (ref 39.0–52.0)
HEMOGLOBIN: 13.7 g/dL (ref 13.0–17.0)
MCH: 28.9 pg (ref 26.0–34.0)
MCHC: 33.8 g/dL (ref 30.0–36.0)
MCV: 85.4 fL (ref 78.0–100.0)
Platelets: 171 10*3/uL (ref 150–400)
RBC: 4.74 MIL/uL (ref 4.22–5.81)
RDW: 12.2 % (ref 11.5–15.5)
WBC: 3.9 10*3/uL — AB (ref 4.0–10.5)

## 2016-03-02 MED ORDER — CIPROFLOXACIN HCL 500 MG PO TABS
500.0000 mg | ORAL_TABLET | Freq: Once | ORAL | Status: AC
  Administered 2016-03-02: 500 mg via ORAL
  Filled 2016-03-02: qty 1

## 2016-03-02 MED ORDER — CIPROFLOXACIN HCL 500 MG PO TABS: 500.0000 mg | ORAL_TABLET | Freq: Two times a day (BID) | ORAL | 0 refills | Status: DC

## 2016-03-02 NOTE — ED Triage Notes (Signed)
Pt here with abdominal pain and emesis.  Pt states on Wednesday he found bacteria in his water.  Facility owned up to problem with fountain.  Pt  Started feeling poorly yesterday. Today with abdominal pain and emesis x 1.  Feels events are related.

## 2016-03-02 NOTE — Progress Notes (Signed)
CM spoke with pt who confirms uninsured [place] resident with no pcp.  CM discussed and provided written information to assist pt with determining choice for uninsured accepting pcps, discussed the importance of pcp vs EDP services for f/u care, www.needymeds.org, www.goodrx.com, discounted pharmacies and other [place] resources such as [facility] , [facility], affordable care act, financial assistance, uninsured dental services, [place] med assist, DSS and  health department  Reviewed resources for [place] uninsured accepting pcps like [facility], [facility] [address], [facility], [facility], local urgent care centers, [facility], [facility], general [facility] clinics, [facility], [facility] plus others, medication resources, [facility] out patient pharmacies and housing Pt voiced understanding and appreciation of resources provided   Provided [facility] contact information Pt agreed to a referral Cm completed referral Pt to be contact by [facility] clinical liaison  

## 2016-03-02 NOTE — ED Provider Notes (Addendum)
[facility]-EMERGENCY DEPT Provider Note   CSN: [id number] Arrival date & time: 03/02/16  1510     History   Chief Complaint Chief Complaint  Patient presents with  . Abdominal Pain  . Emesis    HPI Isaac Sproutnthony Grant is a 30 y.o. male.  The history is provided by the patient.  Abdominal Pain   This is a new problem. The current episode started yesterday. The problem occurs constantly. The problem has been gradually improving. The pain is associated with eating (contaminated water at [employer] 2 days ago). The pain is located in the periumbilical region. The pain is at a severity of 7/10. The pain is moderate. Associated symptoms include anorexia, diarrhea, nausea and vomiting. Pertinent negatives include fever and dysuria. The symptoms are aggravated by eating. Nothing relieves the symptoms. Past workup comments: none. His past medical history does not include gallstones or irritable bowel syndrome.  Emesis   Associated symptoms include abdominal pain and diarrhea. Pertinent negatives include no fever.    Past Medical History:  Diagnosis Date  . Eczema   . Rash    rash on neck    There are no active problems to display for this patient.   Past Surgical History:  Procedure Laterality Date  . HERNIA REPAIR         Home Medications    Prior to Admission medications   Medication Sig Start Date End Date Taking? Authorizing Provider  potassium chloride SA (K-DUR,KLOR-CON) 20 MEQ tablet Take 1 tablet (20 mEq total) by mouth 2 (two) times daily. Patient not taking: Reported on 03/02/2016 09/17/15   Devoria AlbeIva Knapp, MD    Family History Family History  Problem Relation Age of Onset  . Liver disease Father   . Cancer Other     Social History Social History  Substance Use Topics  . Smoking status: Current Every Day Smoker  . Smokeless tobacco: Never Used  . Alcohol use Yes     Comment: Occassionally      Allergies   Penicillins   Review of Systems Review of Systems    Constitutional: Negative for fever.  Gastrointestinal: Positive for abdominal pain, anorexia, diarrhea, nausea and vomiting.  Genitourinary: Negative for dysuria.  All other systems reviewed and are negative.    Physical Exam Updated Vital Signs BP 123/81   Pulse (!) 52   Temp 98 F (36.7 C) (Oral)   Resp 14   SpO2 100%   Physical Exam  Constitutional: He is oriented to person, place, and time. He appears well-developed and well-nourished. No distress.  HENT:  Head: Normocephalic and atraumatic.  Mouth/Throat: Oropharynx is clear and moist.  Eyes: Conjunctivae and EOM are normal. Pupils are equal, round, and reactive to light.  Neck: Normal range of motion. Neck supple.  Cardiovascular: Normal rate, regular rhythm and intact distal pulses.   No murmur heard. Pulmonary/Chest: Effort normal and breath sounds normal. No respiratory distress. He has no wheezes. He has no rales.  Abdominal: Soft. He exhibits no distension. There is tenderness in the periumbilical area. There is no rebound and no guarding.  Minimal tenderness in the umbilical region. No rebound or guarding  Musculoskeletal: Normal range of motion. He exhibits no edema or tenderness.  Neurological: He is alert and oriented to person, place, and time.  Skin: Skin is warm and dry. No rash noted. No erythema.  Psychiatric: He has a normal mood and affect. His behavior is normal.  Nursing note and vitals reviewed.    ED Treatments /  Results  Labs (all labs ordered are listed, but only abnormal results are displayed) Labs Reviewed  COMPREHENSIVE METABOLIC PANEL - Abnormal; Notable for the following:       Result Value   Calcium 8.8 (*)    All other components within normal limits  CBC - Abnormal; Notable for the following:    WBC 3.9 (*)    All other components within normal limits  LIPASE, BLOOD  URINALYSIS, ROUTINE W REFLEX MICROSCOPIC (NOT [facility])    EKG  EKG Interpretation None       Radiology No  results found.  Procedures Procedures (including critical care time)  Medications Ordered in ED Medications  ciprofloxacin (CIPRO) tablet 500 mg (not administered)     Initial Impression / Assessment and Plan / ED Course  I have reviewed the triage vital signs and the nursing notes.  Pertinent labs & imaging results that were available during my care of the patient were reviewed by me and considered in my medical decision making (see chart for details).  Clinical Course    There is a healthy 30 year old male presenting today with abdominal pain, nausea, vomiting and diarrhea that started today. He is concerned for bacterial infection because 2 days ago he was at a fast food chain where he was exposed to contaminated food.  He currently is feeling better than he did earlier vital signs are normal and labs were within normal limits except for mild leukopenia. Patient has minimal. He had mild periumbilical tenderness but no peritoneal signs. He was able to tolerate ginger ale and crackers. Fill the Cipro due to concern for food borne illness. Unable to give a stool sample here.  Final Clinical Impressions(s) / ED Diagnoses   Final diagnoses:  Nausea vomiting and diarrhea    New Prescriptions New Prescriptions   CIPROFLOXACIN (CIPRO) 500 MG TABLET    Take 1 tablet (500 mg total) by mouth 2 (two) times daily.     Isaac SproutWhitney Chekesha Behlke, MD 03/02/16 1800    Isaac SproutWhitney Zaylon Bossier, MD 03/02/16 [phone]

## 2016-03-02 NOTE — Progress Notes (Signed)
Entered d/c instructions Please use the resources provided to you in emergency room by case manager to assist you're your choice of doctor for follow up     These [place] uninsured resources provide possible primary care providers, resources for discounted medications, housing, dental resources, affordable care act information, plus other resources [place]    Instructions: A referral for you has been sent to [facility] if you have not received a call in 3 days you may contact them Call Scherry RanKaren Andrianos at [phone] Tuesday-Friday [email or web address]

## 2016-04-04 ENCOUNTER — Emergency Department (HOSPITAL_COMMUNITY): Payer: Self-pay

## 2016-04-04 ENCOUNTER — Encounter (HOSPITAL_COMMUNITY): Payer: Self-pay | Admitting: Emergency Medicine

## 2016-04-04 DIAGNOSIS — Z79899 Other long term (current) drug therapy: Secondary | ICD-10-CM | POA: Insufficient documentation

## 2016-04-04 DIAGNOSIS — R0789 Other chest pain: Secondary | ICD-10-CM | POA: Insufficient documentation

## 2016-04-04 DIAGNOSIS — F17200 Nicotine dependence, unspecified, uncomplicated: Secondary | ICD-10-CM | POA: Insufficient documentation

## 2016-04-04 LAB — CBC
HEMATOCRIT: 39.4 % (ref 39.0–52.0)
HEMOGLOBIN: 13.3 g/dL (ref 13.0–17.0)
MCH: 29.3 pg (ref 26.0–34.0)
MCHC: 33.8 g/dL (ref 30.0–36.0)
MCV: 86.8 fL (ref 78.0–100.0)
Platelets: 208 10*3/uL (ref 150–400)
RBC: 4.54 MIL/uL (ref 4.22–5.81)
RDW: 12.7 % (ref 11.5–15.5)
WBC: 3.5 10*3/uL — ABNORMAL LOW (ref 4.0–10.5)

## 2016-04-04 LAB — BASIC METABOLIC PANEL
Anion gap: 6 (ref 5–15)
BUN: 7 mg/dL (ref 6–20)
CALCIUM: 9.2 mg/dL (ref 8.9–10.3)
CO2: 28 mmol/L (ref 22–32)
Chloride: 104 mmol/L (ref 101–111)
Creatinine, Ser: 1.04 mg/dL (ref 0.61–1.24)
GFR calc Af Amer: >60 mL/min (ref >60)
Glucose, Bld: 84 mg/dL (ref 65–99)
POTASSIUM: 3.6 mmol/L (ref 3.5–5.1)
SODIUM: 138 mmol/L (ref 135–145)

## 2016-04-04 LAB — I-STAT TROPONIN, ED: Troponin i, poc: 0 ng/mL (ref 0.00–0.08)

## 2016-04-04 NOTE — ED Triage Notes (Signed)
Pt states since last night he has had a pain in his chest and feels like there is 'lump' in the center of his chest. Denies trauma. Alert and oriented.

## 2016-04-05 ENCOUNTER — Encounter (HOSPITAL_COMMUNITY): Payer: Self-pay | Admitting: Emergency Medicine

## 2016-04-05 ENCOUNTER — Emergency Department (HOSPITAL_COMMUNITY)
Admission: EM | Admit: 2016-04-05 | Discharge: 2016-04-05 | Disposition: A | Discharge status: Home / Self Care | Payer: Self-pay | Attending: Emergency Medicine | Admitting: Emergency Medicine

## 2016-04-05 DIAGNOSIS — R0789 Other chest pain: Secondary | ICD-10-CM

## 2016-04-05 MED ORDER — KETOROLAC TROMETHAMINE 60 MG/2ML IM SOLN
60.0000 mg | Freq: Once | INTRAMUSCULAR | Status: AC
  Administered 2016-04-05: 60 mg via INTRAMUSCULAR
  Filled 2016-04-05: qty 2

## 2016-04-05 MED ORDER — NAPROXEN 500 MG PO TABS: 500.0000 mg | ORAL_TABLET | Freq: Two times a day (BID) | ORAL | 0 refills | Status: DC

## 2016-04-05 MED ORDER — METHOCARBAMOL 500 MG PO TABS
1000.0000 mg | ORAL_TABLET | Freq: Once | ORAL | Status: AC
  Administered 2016-04-05: 1000 mg via ORAL
  Filled 2016-04-05: qty 2

## 2016-04-05 MED ORDER — METHOCARBAMOL 500 MG PO TABS: 500.0000 mg | ORAL_TABLET | Freq: Two times a day (BID) | ORAL | 0 refills | Status: DC

## 2016-04-05 NOTE — ED Provider Notes (Signed)
[facility]-EMERGENCY DEPT Provider Note   CSN: [id number] Arrival date & time: 04/04/16  2154   By signing my name below, I, Suzan Slick. Elon Spanner, attest that this documentation has been prepared under the direction and in the presence of Jaece Ducharme, MD.  Electronically Signed: Suzan Slick. Elon Spanner, ED Scribe. 04/05/16. 2:33 AM.   History   Chief Complaint Chief Complaint  Patient presents with  . Chest Pain   The history is provided by the patient. No language interpreter was used.  Chest Pain   This is a new problem. The current episode started 12 to 24 hours ago. The problem occurs constantly. The problem has not changed since onset.The pain is associated with movement. The pain is present in the substernal region. The pain is moderate. The quality of the pain is described as dull. The pain does not radiate. Duration of episode(s) is 1 day. The symptoms are aggravated by certain positions. Pertinent negatives include no cough, no diaphoresis, no fever, no hemoptysis, no irregular heartbeat, no leg pain, no lower extremity edema, no nausea, no palpitations, no shortness of breath, no syncope and no vomiting. He has tried nothing for the symptoms. The treatment provided no relief. There are no known risk factors.  Pertinent negatives for past medical history include no MI and no PVD.    HPI Comments: Dequavion Follette is a 30 y.o. male without any pertinent past medical history who presents to the Emergency Department complaining of constant, unchanged, non-radiating substernal chest pain x 1 day. Pt states "it feels like a knot". Pt states chest pain is exacerbated with movement. No alleviating factors at this time. No OTC medications or home remedies attempted prior to arrival. No recent fever, chills, nausea, or vomiting.  Pain is worse with crossing arm over his chest.  No [place] car trips or plane trips.  No leg pain or swelling  PCP: No primary care provider on file.    Past Medical History:    Diagnosis Date  . Eczema   . Rash    rash on neck    There are no active problems to display for this patient.   Past Surgical History:  Procedure Laterality Date  . HERNIA REPAIR         Home Medications    Prior to Admission medications   Medication Sig Start Date End Date Taking? Authorizing Provider  ciprofloxacin (CIPRO) 500 MG tablet Take 1 tablet (500 mg total) by mouth 2 (two) times daily. 03/02/16   Gwyneth Sprout, MD  potassium chloride SA (K-DUR,KLOR-CON) 20 MEQ tablet Take 1 tablet (20 mEq total) by mouth 2 (two) times daily. Patient not taking: Reported on 03/02/2016 09/17/15   Devoria Albe, MD    Family History Family History  Problem Relation Age of Onset  . Liver disease Father   . Cancer Other     Social History Social History  Substance Use Topics  . Smoking status: Current Every Day Smoker  . Smokeless tobacco: Never Used  . Alcohol use Yes     Comment: Occassionally      Allergies   Penicillins   Review of Systems Review of Systems  Constitutional: Negative for chills, diaphoresis and fever.  Respiratory: Negative for cough, hemoptysis and shortness of breath.   Cardiovascular: Positive for chest pain. Negative for palpitations, leg swelling and syncope.  Gastrointestinal: Negative for nausea and vomiting.  Musculoskeletal: Negative for neck pain and neck stiffness.  All other systems reviewed and are negative.  Physical Exam Updated Vital Signs BP 124/71 (BP Location: Left Arm)   Pulse 72   Temp 98.1 F (36.7 C) (Oral)   Resp 18   SpO2 97%   Physical Exam  Constitutional: He is oriented to person, place, and time. He appears well-developed and well-nourished.  HENT:  Head: Normocephalic and atraumatic.  Mouth/Throat: Oropharynx is clear and moist.  Eyes: EOM are normal. Pupils are equal, round, and reactive to light.  Neck: Normal range of motion.  No carotid bruits. Trachea is midline.  Cardiovascular: Normal rate,  regular rhythm, normal heart sounds and intact distal pulses.   Pulmonary/Chest: Effort normal and breath sounds normal. No stridor. No respiratory distress. He has no wheezes. He exhibits tenderness.  Tenderness to sternal manubrial junction  Abdominal: Soft. He exhibits no distension and no mass. There is no tenderness. There is no guarding.  Musculoskeletal: Normal range of motion.  All compartments are soft. No cords. No swelling.  Neurological: He is alert and oriented to person, place, and time. He has normal reflexes.  Skin: Skin is warm and dry.  Psychiatric: He has a normal mood and affect. Judgment normal.  Nursing note and vitals reviewed.    ED Treatments / Results   DIAGNOSTIC STUDIES: Oxygen Saturation is 97% on RA, adequate by my interpretation.    COORDINATION OF CARE: 2:29 AM-Discussed treatment plan with pt at bedside and pt agreed to plan.     Vitals:   04/04/16 2206 04/05/16 0224  BP: 124/71 129/86  Pulse: 72 64  Resp: 18 18  Temp: 98.1 F (36.7 C)     Labs Results for orders placed or performed during the hospital encounter of 04/05/16  Basic metabolic panel  Result Value Ref Range   Sodium 138 135 - 145 mmol/L   Potassium 3.6 3.5 - 5.1 mmol/L   Chloride 104 101 - 111 mmol/L   CO2 28 22 - 32 mmol/L   Glucose, Bld 84 65 - 99 mg/dL   BUN 7 6 - 20 mg/dL   Creatinine, Ser 1.61 0.61 - 1.24 mg/dL   Calcium 9.2 8.9 - 09.6 mg/dL   GFR calc non Af Amer >60 >60 mL/min   GFR calc Af Amer >60 >60 mL/min   Anion gap 6 5 - 15  CBC  Result Value Ref Range   WBC 3.5 (L) 4.0 - 10.5 K/uL   RBC 4.54 4.22 - 5.81 MIL/uL   Hemoglobin 13.3 13.0 - 17.0 g/dL   HCT 04.5 40.9 - 81.1 %   MCV 86.8 78.0 - 100.0 fL   MCH 29.3 26.0 - 34.0 pg   MCHC 33.8 30.0 - 36.0 g/dL   RDW 91.4 78.2 - 95.6 %   Platelets 208 150 - 400 K/uL  I-stat troponin, ED  Result Value Ref Range   Troponin i, poc 0.00 0.00 - 0.08 ng/mL   Comment 3           Dg Chest 2 View  Result Date:  04/04/2016 CLINICAL DATA:  Sternal chest pain onset yesterday. Current smoker. History of bronchitis. EXAM: CHEST  2 VIEW COMPARISON:  09/16/2015 FINDINGS: The heart size and mediastinal contours are within normal limits. Both lungs are clear. The visualized skeletal structures are unremarkable. IMPRESSION: No active cardiopulmonary disease. Electronically Signed   By: Burman Nieves M.D.   On: 04/04/2016 22:36   Medications  ketorolac (TORADOL) injection 60 mg (not administered)  methocarbamol (ROBAXIN) tablet 1,000 mg (not administered)    Procedures Procedures (including  critical care time)  Medications Ordered in ED Medications - No data to display   Initial Impression / Assessment and Plan / ED Course  I have reviewed the triage vital signs and the nursing notes.  Pertinent labs & imaging results that were available during my care of the patient were reviewed by me and considered in my medical decision making (see chart for details).  HEART score 1 low risk for MACE especially given the highly atypical story.  PERC negative wells 0 highly doubt PE in this very low risk   Final Clinical Impressions(s) / ED Diagnoses   Final diagnoses:  None   All questions answered to patient's satisfaction. Based on history and exam patient has been appropriately medically screened and emergency conditions excluded. Patient is stable for discharge at this time. Follow up with your PMD for recheck in 2 days and strict return precautions given   New Prescriptions New Prescriptions   No medications on file   I personally performed the services described in this documentation, which was scribed in my presence. The recorded information has been reviewed and is accurate.      Cy BlamerApril Robynne Roat, MD 04/05/16 [phone]

## 2016-06-06 ENCOUNTER — Emergency Department (HOSPITAL_COMMUNITY)
Admission: EM | Admit: 2016-06-06 | Discharge: 2016-06-06 | Disposition: A | Discharge status: Home / Self Care | Payer: Self-pay | Attending: Emergency Medicine | Admitting: Emergency Medicine

## 2016-06-06 ENCOUNTER — Emergency Department (HOSPITAL_COMMUNITY): Payer: Self-pay

## 2016-06-06 ENCOUNTER — Encounter (HOSPITAL_COMMUNITY): Payer: Self-pay | Admitting: Physical Medicine and Rehabilitation

## 2016-06-06 DIAGNOSIS — R51 Headache: Secondary | ICD-10-CM

## 2016-06-06 DIAGNOSIS — F17210 Nicotine dependence, cigarettes, uncomplicated: Secondary | ICD-10-CM | POA: Insufficient documentation

## 2016-06-06 DIAGNOSIS — Y999 Unspecified external cause status: Secondary | ICD-10-CM | POA: Insufficient documentation

## 2016-06-06 DIAGNOSIS — Y939 Activity, unspecified: Secondary | ICD-10-CM | POA: Insufficient documentation

## 2016-06-06 DIAGNOSIS — R220 Localized swelling, mass and lump, head: Secondary | ICD-10-CM | POA: Insufficient documentation

## 2016-06-06 DIAGNOSIS — S0990XA Unspecified injury of head, initial encounter: Secondary | ICD-10-CM | POA: Insufficient documentation

## 2016-06-06 DIAGNOSIS — R519 Headache, unspecified: Secondary | ICD-10-CM

## 2016-06-06 DIAGNOSIS — Y92149 Unspecified place in prison as the place of occurrence of the external cause: Secondary | ICD-10-CM | POA: Insufficient documentation

## 2016-06-06 MED ORDER — NAPROXEN 500 MG PO TABS: 500.0000 mg | ORAL_TABLET | Freq: Two times a day (BID) | ORAL | 0 refills | Status: AC
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
## 2017-09-23 IMAGING — CR DG CHEST 2V
2 series · 2 of 2 positions shown · non-contrast
Comparison: 09/16/2015

CLINICAL DATA: Sternal chest pain onset yesterday. Current smoker.
History of bronchitis.

EXAM:
CHEST  2 VIEW

[w chest pa]
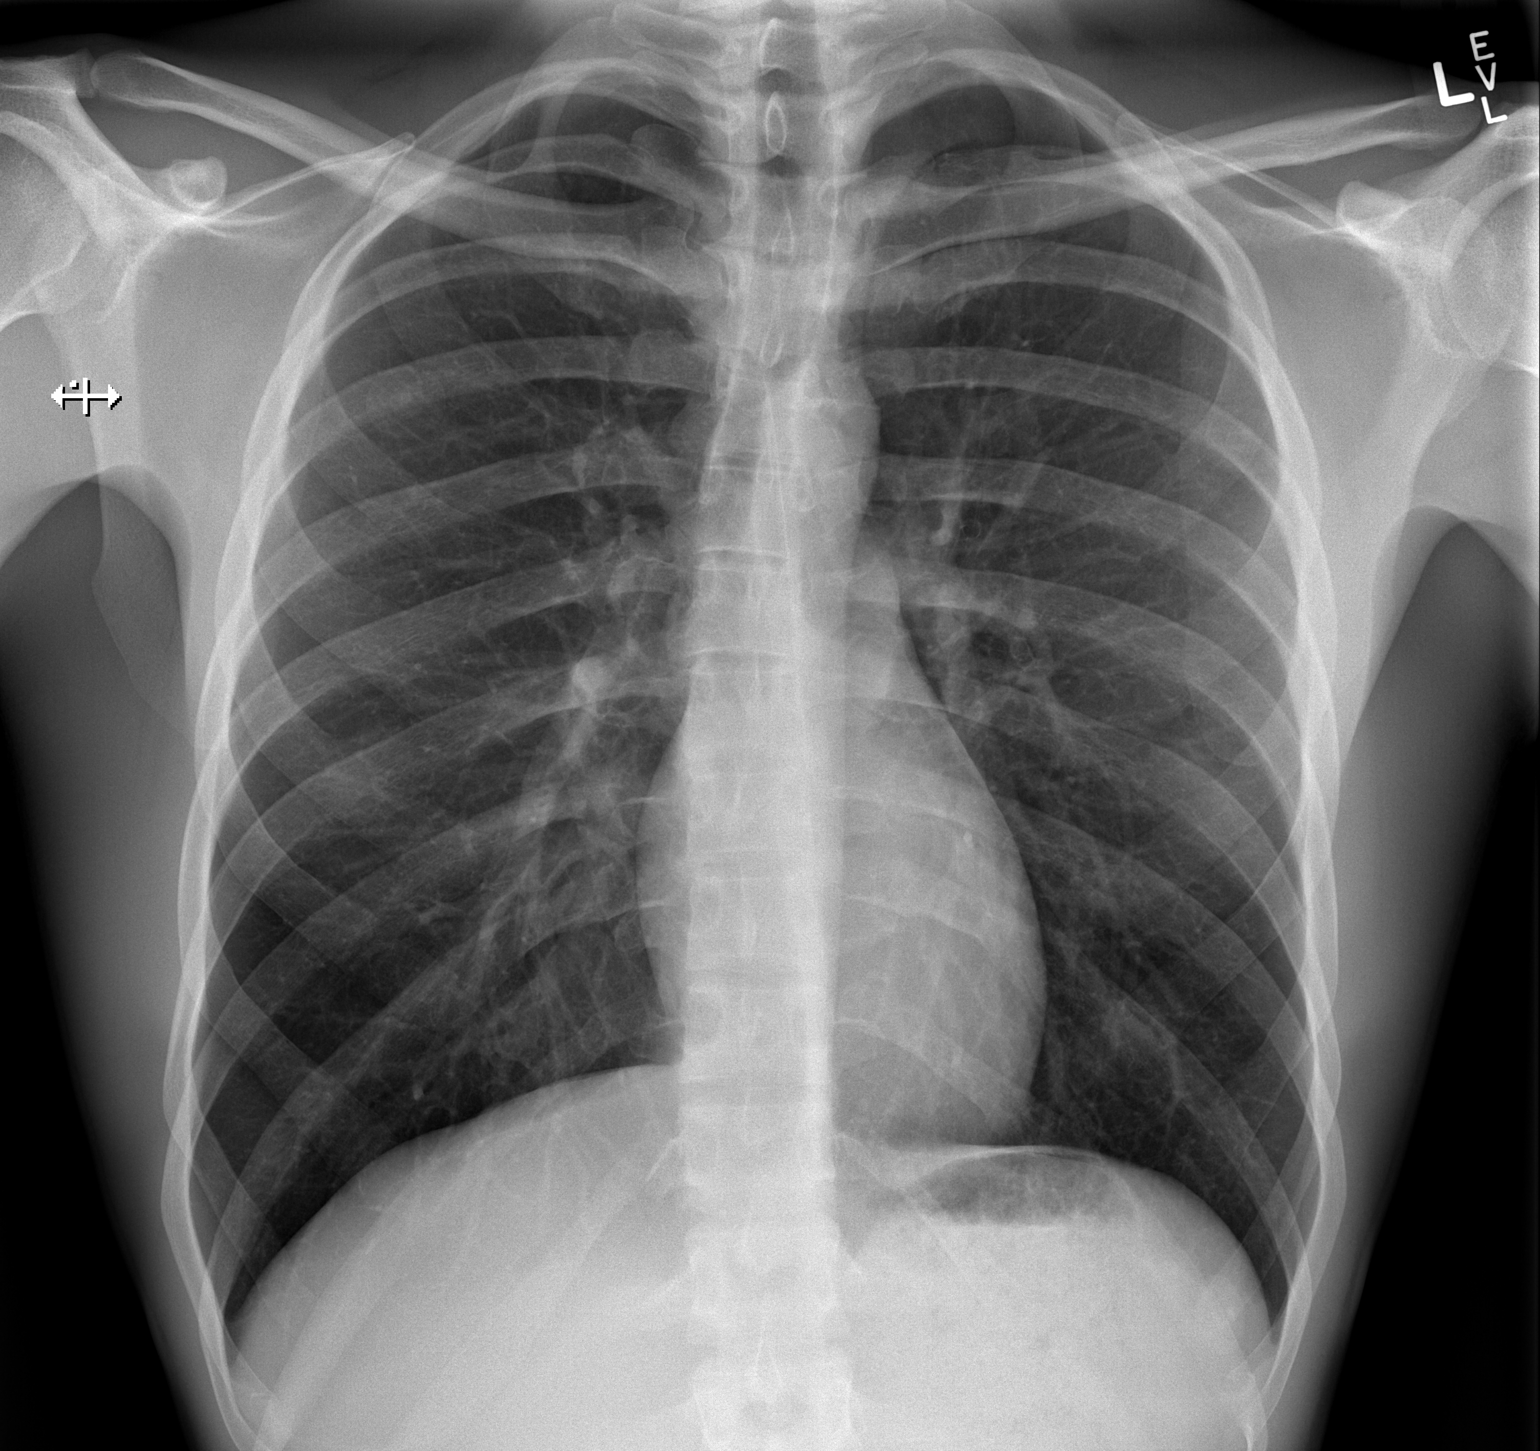

[w chest lat]
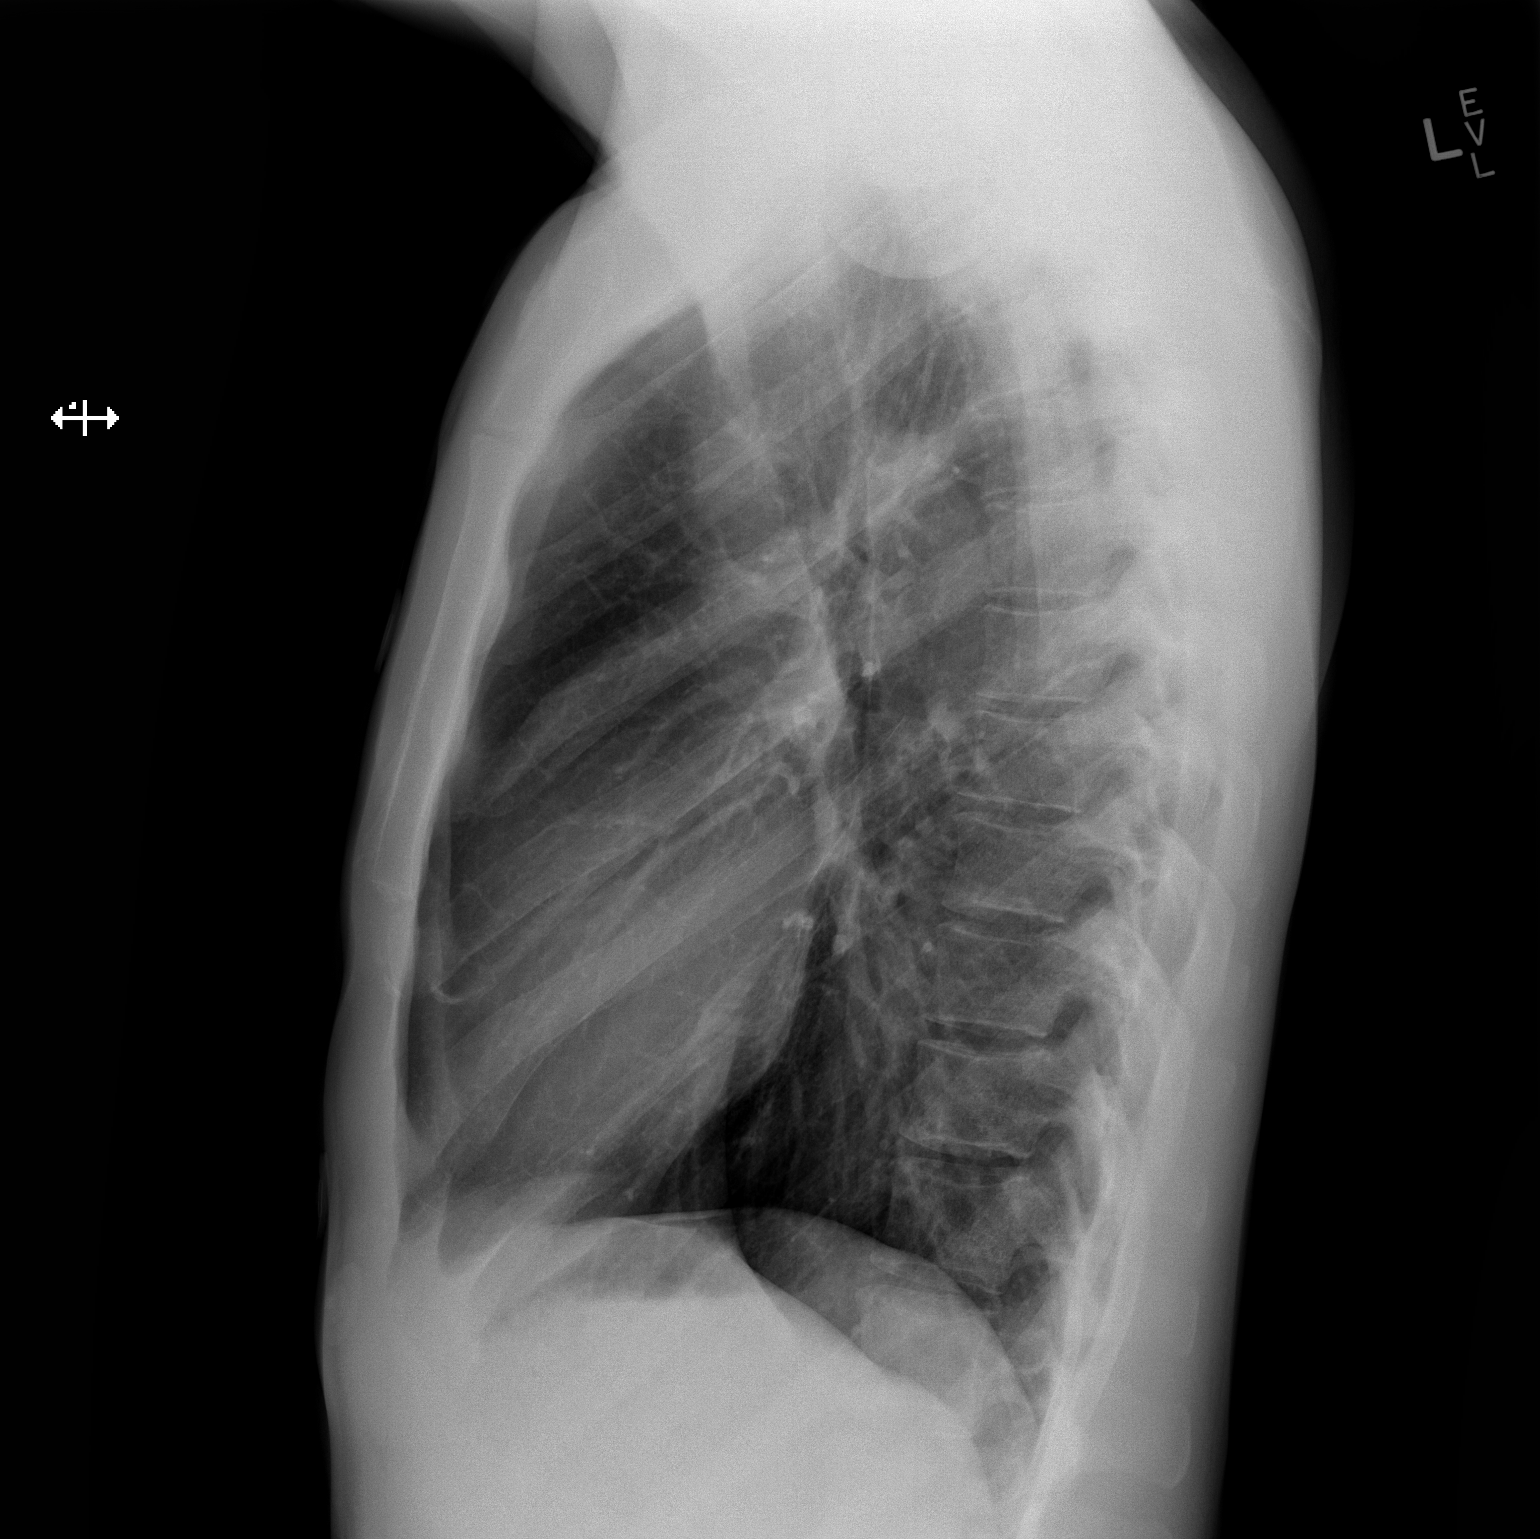

[2 of 2 positions shown; findings below may reference images not displayed]

FINDINGS: The heart size and mediastinal contours are within normal limits.
Both lungs are clear. The visualized skeletal structures are
unremarkable.
IMPRESSION: No active cardiopulmonary disease.
